# Patient Record
Sex: Male | Born: 1965 | Race: White | Hispanic: No | Marital: Single | Smoking: Never smoker
Health system: Southern US, Community
[De-identification: ages and names within clinical notes are randomized; demographics above are authoritative.]

## PROBLEM LIST (undated history)

## (undated) DIAGNOSIS — J45909 Unspecified asthma, uncomplicated: Secondary | ICD-10-CM

## (undated) HISTORY — PX: CHOLECYSTECTOMY: SHX55

## (undated) HISTORY — PX: BACK SURGERY: SHX140

---

## 2005-05-27 ENCOUNTER — Ambulatory Visit (HOSPITAL_COMMUNITY): Admission: RE | Admit: 2005-05-27 | Discharge: 2005-05-27 | Payer: Self-pay | Admitting: Family Medicine

## 2005-05-30 ENCOUNTER — Encounter (HOSPITAL_COMMUNITY): Admission: RE | Admit: 2005-05-30 | Discharge: 2005-06-29 | Payer: Self-pay | Admitting: Family Medicine

## 2005-06-05 ENCOUNTER — Ambulatory Visit: Payer: Self-pay | Admitting: Gastroenterology

## 2005-06-11 ENCOUNTER — Ambulatory Visit (HOSPITAL_COMMUNITY): Admission: RE | Admit: 2005-06-11 | Discharge: 2005-06-11 | Payer: Self-pay | Admitting: Internal Medicine

## 2005-06-11 ENCOUNTER — Ambulatory Visit: Payer: Self-pay | Admitting: Internal Medicine

## 2005-06-25 ENCOUNTER — Ambulatory Visit (HOSPITAL_COMMUNITY): Admission: RE | Admit: 2005-06-25 | Discharge: 2005-06-25 | Payer: Self-pay | Admitting: Internal Medicine

## 2005-07-14 ENCOUNTER — Observation Stay (HOSPITAL_COMMUNITY): Admission: RE | Admit: 2005-07-14 | Discharge: 2005-07-15 | Payer: Self-pay | Admitting: General Surgery

## 2005-07-14 ENCOUNTER — Encounter (INDEPENDENT_AMBULATORY_CARE_PROVIDER_SITE_OTHER): Payer: Self-pay | Admitting: Specialist

## 2005-07-31 ENCOUNTER — Ambulatory Visit (HOSPITAL_COMMUNITY): Admission: RE | Admit: 2005-07-31 | Discharge: 2005-07-31 | Payer: Self-pay | Admitting: General Surgery

## 2007-08-30 ENCOUNTER — Ambulatory Visit (HOSPITAL_COMMUNITY): Admission: RE | Admit: 2007-08-30 | Discharge: 2007-08-30 | Payer: Self-pay | Admitting: Family Medicine

## 2008-01-07 ENCOUNTER — Emergency Department (HOSPITAL_COMMUNITY): Admission: EM | Admit: 2008-01-07 | Discharge: 2008-01-07 | Payer: Self-pay | Admitting: Emergency Medicine

## 2008-02-29 ENCOUNTER — Ambulatory Visit (HOSPITAL_COMMUNITY): Admission: RE | Admit: 2008-02-29 | Discharge: 2008-02-29 | Payer: Self-pay | Admitting: Family Medicine

## 2008-03-02 ENCOUNTER — Encounter: Admission: RE | Admit: 2008-03-02 | Discharge: 2008-03-02 | Payer: Self-pay | Admitting: Family Medicine

## 2008-03-16 ENCOUNTER — Encounter: Admission: RE | Admit: 2008-03-16 | Discharge: 2008-03-16 | Payer: Self-pay | Admitting: Family Medicine

## 2008-04-10 ENCOUNTER — Ambulatory Visit (HOSPITAL_COMMUNITY): Admission: RE | Admit: 2008-04-10 | Discharge: 2008-04-11 | Payer: Self-pay | Admitting: Neurological Surgery

## 2009-04-20 ENCOUNTER — Ambulatory Visit (HOSPITAL_COMMUNITY): Admission: RE | Admit: 2009-04-20 | Discharge: 2009-04-20 | Payer: Self-pay | Admitting: Family Medicine

## 2010-03-31 ENCOUNTER — Encounter: Payer: Self-pay | Admitting: Family Medicine

## 2010-06-24 LAB — BASIC METABOLIC PANEL
GFR calc Af Amer: >60 mL/min (ref >60)
Glucose, Bld: 109 mg/dL — ABNORMAL HIGH (ref 70–99)
Potassium: 4.7 mEq/L (ref 3.5–5.1)
Sodium: 139 mEq/L (ref 135–145)

## 2010-06-24 LAB — CBC
HCT: 43.2 % (ref 39.0–52.0)
Hemoglobin: 14.4 g/dL (ref 13.0–17.0)
MCHC: 33.5 g/dL (ref 30.0–36.0)
MCV: 87.7 fL (ref 78.0–100.0)
RBC: 4.92 MIL/uL (ref 4.22–5.81)
RDW: 13.2 % (ref 11.5–15.5)
WBC: 7.2 10*3/uL (ref 4.0–10.5)

## 2010-07-23 NOTE — Op Note (Signed)
Miguel Wise, Miguel Wise                 ACCOUNT NO.:  [id number]   MEDICAL RECORD NO.:  [id number]          PATIENT TYPE:  OIB   LOCATION:  3535                         FACILITY:  [facility]   PHYSICIAN:  Stefani Dama, M.D.  DATE OF BIRTH:  03/09/1966   DATE OF PROCEDURE:  04/10/2008  DATE OF DISCHARGE:                               OPERATIVE REPORT   PREOPERATIVE DIAGNOSES:  Herniated nucleus pulposus, L2-L3 on the right  with the right lumbar radiculopathy L3 distribution.   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   PROCEDURE:  Laminotomy and microdiskectomy, L2-L3 right with operating  microscope microdissection technique.   SURGEON:  Stefani Dama, MD   FIRST ASSISTANT:  Danae Orleans. Venetia Maxon, MD   ANESTHESIA:  General endotracheal.   INDICATIONS:  Mr. Melman is a 45 year old individual who has had  significant neck, back, and right lower extremity pain and weakness.  He  has evidence of herniated nucleus pulposus at L2-L3 with a large  fragment that is resting behind the body blocking the L3 nerve root  exit.  He has been advised regarding surgical decompression via  laminotomy and microdiskectomy.   PROCEDURE:  The patient was brought to the operating room supine on the  stretcher.  After smooth induction of general endotracheal anesthesia,  he was turned prone.  The back was prepped with alcohol and DuraPrep,  and draped in sterile fashion.  Midline incision was created and carried  down to the lumbodorsal fascia after the L2-L3 space was identified.  The dye section was opened on the left side of the midline doing a  subperiosteal dissection at L2-L3.  Self-retaining Caspar retractor was  then placed.  A second localizing radiograph identified the L2-3 space  positively and then a laminotomy was created in this area, removing the  inferior margin lamina of L2 out to the medial wall of the facet  and  partial medial facetectomy was performed.  Superior margin lamina of L3  was also removed and the laminotomy was enlarged.  During the initial  opening, there was a small dural tear in the midline of the dura, which  was oversewn with two 6-0 Prolene sutures in interrupted fashion.  Then,  the dissection was carried out laterally.  The L3 nerve root was noted  be both dorsally and into the foramen and by dissecting underneath this,  a singular large fragment of disk was encountered and removed.  Removal  of the disk yielded significant hemorrhage, which required multiple  packings with Gelfoam to be able to control.  The hemostasis was  gradually obtained and further exploration with the use of the operating  microscope and microdissection technique yielded no other fragments.  Some posterior longitudinal ligament in this area was cauterized for  hemostasis and further exploration above and below the ligament, medial  and distal to it and also proximally to the level of the L2 nerve root  foramen yielded no other fragments.  The disk space itself was noted to  be intact.  There was a small bulge from little osteophytosis at this  level.  However, because of disk space itself appeared close and the  disk space was not explored.  With the decompression thus being  completed, hemostasis in the epidural space was achieved.  DuraSeal was  placed over the common dural tube and then the retractor was removed.  The lumbodorsal fascia was then  closed with #1 Vicryl in interrupted fashion, 2-0 Vicryl used in a  subcutaneous tissues, 3-0 Vicryl subcuticularly, and Dermabond was  placed on the skin.  The patient tolerated the procedure well and was  returned to recovery room in stable condition.      Stefani Dama, M.D.  Electronically Signed     HJE/MEDQ  D:  04/10/2008  T:  04/10/2008  Job:  [id number]

## 2010-07-26 NOTE — Op Note (Signed)
NAMEWOODARD, PERRELL                 ACCOUNT NO.:  [id number]   MEDICAL RECORD NO.:  [id number]          PATIENT TYPE:  AMB   LOCATION:  DAY                           FACILITY:  [facility]   PHYSICIAN:  Lionel December, M.D.    DATE OF BIRTH:  [birth year]   DATE OF PROCEDURE:  06/11/2005  DATE OF DISCHARGE:                                 OPERATIVE REPORT   PROCEDURE:  Esophagogastroduodenoscopy.   INDICATION:  Miguel Wise is a 45 year old Caucasian male with 10-week history of  postprandial nausea and vomiting occurring at least three to four times a  week.  He had negative upper abdominal ultrasound.  He had a hepatobiliary  scan with EF of 26.2% which is low, but he has had no pain whatsoever.  His  CBC, sed rate and comprehensive chemistry panel has been normal.  He has not  responded to PPI therapy.  He is undergoing diagnostic EGD.  Procedure risks  were reviewed the patient, informed consent was obtained.   MEDS FOR CONSCIOUS SEDATION:  Benzocaine spray for pharyngeal topical  anesthesia, Demerol 50 mg IV, Versed 6 mg IV.   FINDINGS:  Procedure performed in endoscopy suite.  The patient's vital  signs and O2 sat were monitored during procedure and remained stable.  The  patient was placed left lateral position and Olympus videoscope was via  oropharynx without any difficulty into esophagus.   Esophagus.  Mucosa of the esophagus was normal in the proximal and middle  segment.  Distal 4 to 5 cm have had 6 or 7 small erosions.  GE junction was  at 40 cm from the incisors and was normal.   Stomach.  It was empty and distended very well with insufflation.  Folds of  proximal stomach were normal.  Examination mucosa at body, antrum, pyloric  channel as well as angularis, fundus and cardia was normal.   Duodenum. Bulbar mucosa was normal.  Scope was passed second part of  duodenum where mucosa and folds were normal.  Endoscope was withdrawn.  The  patient tolerated the procedure well.   FINAL  DIAGNOSIS:  Erosive reflux esophagitis, otherwise normal EGD.   Etiology of his nausea, vomiting remains unclear.   RECOMMENDATIONS:  Will repeat his LFTs today and also check serum calcium  which has not been checked before as well as TSH.  For the time he will  continue Protonix 40 mg q.a.m. and I have asked him to keep written record  of these episodes.   If lab studies are completely normal, will proceed with abdominal pelvic CT  with oral and IV contrast and finally with gastric emptying study.  Abdominal pelvic CT and if this study is also normal, will do gastric  emptying study.      Lionel December, M.D.  Electronically Signed     NR/MEDQ  D:  06/11/2005  T:  06/12/2005  Job:  [id number]   cc:   Mila Homer. Sudie Bailey, M.D.  Fax: [phone]

## 2010-07-26 NOTE — Consult Note (Signed)
Miguel Wise, Miguel Wise                 ACCOUNT NO.:  [id number]   MEDICAL RECORD NO.:  [id number]          PATIENT TYPE:  AMB   LOCATION:  DAY                           FACILITY:  [facility]   PHYSICIAN:  Lionel December, M.D.    DATE OF BIRTH:  21-Aug-1965   DATE OF CONSULTATION:  06/05/2005  DATE OF DISCHARGE:                                   CONSULTATION   CHIEF COMPLAINT:  Postprandial nausea and vomiting.   REQUESTING PHYSICIAN:  Mila Homer. Sudie Bailey, M.D.   HISTORY OF PRESENT ILLNESS:  The patient is a 45 year old Caucasian male  with 8 to 10 week history of postprandial nausea, vomiting. Initially  occurred about every eight to nine days but is becoming more frequent. He is  now having vomiting a couple of times a week. There is no abdominal pain  whatsoever. Vomiting has occurred after various types of meals including  Cheerios, egg sandwich, pizza, etc. He is scared to eat and decreased his  oral intact. Really denies any significant weight loss. Bowel movements  generally have been regular up until recently, and he may go two days  without a BM now. Denies any melena, hematemesis, hematochezia. He had  subjective fever a couple of nights ago. This was isolated. He has gained 25  pounds in the last 6 months. He complains of intermittent heartburn which he  does not take anything for.   CURRENT MEDICATIONS:  1.  Allegra 180 mg p.r.n.  2.  Singulair daily.   ALLERGIES:  CODEINE.   PAST MEDICAL HISTORY:  1.  Seasonal allergies.  2.  Gastroesophageal reflux disease.   PAST SURGICAL HISTORY:  Right wrist surgery.   FAMILY HISTORY:  Father died of stroke at age 45. No family history of  colorectal cancer.   SOCIAL HISTORY:  He is single. He has one son. He is employed at [employer]. He has never been a smoker. Consumes one beer once or twice a day  when he plays golf.   REVIEW OF SYSTEMS:  See HPI for GI, constitutional. CARDIOPULMONARY:  Denies  chest pain or shortness  of breath.   PHYSICAL EXAMINATION:  VITAL SIGNS:  Weight 294, height 6 foot, temperature  98.3, blood pressure 128/86, pulse 92.  GENERAL:  Pleasant, morbidly obese, Caucasian male in no acute distress.  SKIN:  Warm and dry. No jaundice.  HEENT:  Conjunctivae are pink. Sclerae are nonicteric. Oropharyngeal moist  and pink. No lesions, erythema or exudate. No lymphadenopathy or  thyromegaly.  CHEST:  Lungs are clear to auscultation.  CARDIAC:  Reveals regular rate and rhythm. Normal S1 and S2. No murmurs,  rubs, or gallops.  ABDOMEN:  Positive bowel sounds. Obese but symmetrical, soft, nontender. No  organomegaly or masses. No rebound tenderness or guarding. No abdominal  bruits or hernias.  EXTREMITIES:  No edema.   IMPRESSION:  The patient is a 45 year old gentleman with 8 to 10 week  history of intermittent postprandial nausea and vomiting. Etiology is  unclear at this time. Symptoms were not typical of biliary dyskinesia. He  did have  a borderline low gallbladder ejection fraction of 26% on recent  HIDA scan but did not have reproduction of symptoms with fatty meal. He has  diffuse fatty infiltration of the liver and focal wall thickening of the  fundus at the gallbladder of unclear significance (on ultrasound). He does  have some typical heartburn symptoms. Prior to consideration of surgical  opinion for cholecystectomy, would highly recommend having his upper GI  tract evaluated via endoscopy. We will also give him a trial of PPI therapy  for GERD.   PLAN:  1.  EGD with Dr. Karilyn Cota in the near future.  2.  Protonix 40 mg daily, #20 samples provided.  3.  Will retrieve lab results from Dr. Michelle Nasuti office for review.      Tana Coast, P.A.      Lionel December, M.D.  Electronically Signed    LL/MEDQ  D:  06/05/2005  T:  06/07/2005  Job:  [id number]

## 2010-07-26 NOTE — Op Note (Signed)
NAMEENRICO, EADDY NO.:  [id number]   MEDICAL RECORD NO.:  [id number]          PATIENT TYPE:  INP   LOCATION:  A328                          FACILITY:  [facility]   PHYSICIAN:  Barbaraann Barthel, M.D. DATE OF BIRTH:  Sep 26, 1965   DATE OF PROCEDURE:  07/14/2005  DATE OF DISCHARGE:                                 OPERATIVE REPORT   SURGEON:  Dr. Barbaraann Barthel.   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   Note, this is a 45 year old white male who had recurrent episodes at least  10 weeks in duration of right upper quadrant discomfort with nausea and  vomiting. This pain was postprandial in nature and located in his right  upper quadrant radiated somewhat to his back.  The sonogram did not show any  evidence of stones; however, this showed a thickened gallbladder, CT scan  was negative, upper GI was negative. The hepatobiliary scan showed signs  suggestive of cholecystitis secondary to biliary dyskinesia with a  diminished ejection fraction of 26%.  He had no relief with restrictive diet  only minimal relief for this and no great improvement with Protonix.  He was  referred by the GI service for laparoscopic cholecystectomy.   We discussed the procedure in detail discussing complications not limited to  but including bleeding, infection, damage to bile ducts, perforation of  organs, transitory diarrhea and the possibility than an open procedure may  be required.  Informed consent was obtained.   GROSS OPERATIVE FINDINGS:  The patient had a gallbladder that had almost a  fatty envelope around it with fatty inflammation and we noted a small stone  that was sucked up through the suction device when we were removing the  gallbladder.  Other than that, no other abnormalities were encountered in  the right upper quadrant.   SPECIMEN:  Gallbladder.   TECHNIQUE:  The patient was placed in the supine position after the adequate  administration of general anesthesia via endotracheal intubation. His entire  abdomen was prepped with Betadine solution and draped in the usual manner.  Prior to this, a Foley catheter was aseptically inserted. With the patient  in Trendelenburg, an incision was carried out over the superior aspect of  the umbilicus. The subcutaneous tissue was dissected down to the fascia and  this was grasped with a sharp towel clip.  A Veress needle was inserted and  confirmed in position with a saline drop test.  We then placed an 11 mm  cannula in this site with a camera. We did this using the Visiport  technique.  We then under direct vision placed an 11 mm cannula in the  epigastrium and two 5 mm cannulas in the right upper quadrant laterally.  The gallbladder was grasped, its adhesions were taken down, the cystic duct  was clearly visualized.  At least four clips were placed on this distally  and the cystic artery was likewise doubly silver clipped and divided.  The  gallbladder was then  removed from the liver bed using the cautery device.  We then irrigated with normal saline solution.  I elected to place a Al Pimple drain in the liver bed and Surgicel.  After checking for hemostasis  and irrigating, we then desufflated the abdomen.  We closed the 11-mm  cannula sites in the epigastrium and the umbilicus with #0  Polysorb sutures  and then closed all incisions with the stapling device.  Prior to this, we  used 0.5% Sensorcaine at the port sites for postoperative comfort.  The  drain was sutured in place with 3-0 nylon.  Prior to closure, all sponge,  needle and instrument counts were found to be correct.  Estimated blood loss  was minimal.  The patient received 1500 mL of crystalloids intraoperatively.  There were no complications.      Barbaraann Barthel, M.D.  Electronically  Signed     WB/MEDQ  D:  07/14/2005  T:  07/15/2005  Job:  [id number]   cc:   Mila Homer. Sudie Bailey, M.D.  Fax: [phone]   Lionel December, M.D.  [address]  [place] [id number]

## 2010-12-09 LAB — URINALYSIS, ROUTINE W REFLEX MICROSCOPIC
Hgb urine dipstick: NEGATIVE
Nitrite: NEGATIVE
Protein, ur: NEGATIVE
Urobilinogen, UA: 0.2
pH: 5.5

## 2013-06-10 ENCOUNTER — Encounter (HOSPITAL_COMMUNITY): Payer: Self-pay | Admitting: Emergency Medicine

## 2013-06-10 ENCOUNTER — Emergency Department (HOSPITAL_COMMUNITY)
Admission: EM | Admit: 2013-06-10 | Discharge: 2013-06-10 | Disposition: A | Discharge status: Home / Self Care | Payer: BC Managed Care – PPO | Attending: Emergency Medicine | Admitting: Emergency Medicine

## 2013-06-10 DIAGNOSIS — J069 Acute upper respiratory infection, unspecified: Secondary | ICD-10-CM | POA: Insufficient documentation

## 2013-06-10 DIAGNOSIS — Z79899 Other long term (current) drug therapy: Secondary | ICD-10-CM | POA: Insufficient documentation

## 2013-06-10 DIAGNOSIS — J45909 Unspecified asthma, uncomplicated: Secondary | ICD-10-CM | POA: Insufficient documentation

## 2013-06-10 HISTORY — DX: Unspecified asthma, uncomplicated: J45.909

## 2013-06-10 MED ORDER — HYDROCOD POLST-CHLORPHEN POLST 10-8 MG/5ML PO LQCR: 5.0000 mL | Freq: Two times a day (BID) | ORAL | Status: DC | PRN

## 2013-06-10 MED ORDER — PREDNISONE 10 MG PO TABS: 20.0000 mg | ORAL_TABLET | Freq: Two times a day (BID) | ORAL | Status: DC

## 2013-06-10 NOTE — ED Notes (Signed)
Cough and congestion for a week, states his tongue feels funny past couple of days also, has a film on it and it feels like it is burning.  Pt has one dose of azithromycin left to take from his pmd

## 2013-06-10 NOTE — Discharge Instructions (Signed)
Continue Zithromax and Proair inhaler as before.  Start prednisone and Tussionex as prescribed.  Followup with your Dr. if not improving in the next several days, and return to the ER if you develop difficulty breathing, severe chest pain, or other new and concerning symptoms.   Upper Respiratory Infection, Adult An upper respiratory infection (URI) is also sometimes known as the common cold. The upper respiratory tract includes the nose, sinuses, throat, trachea, and bronchi. Bronchi are the airways leading to the lungs. Most people improve within 1 week, but symptoms can last up to 2 weeks. A residual cough may last even longer.  CAUSES Many different viruses can infect the tissues lining the upper respiratory tract. The tissues become irritated and inflamed and often become very moist. Mucus production is also common. A cold is contagious. You can easily spread the virus to others by oral contact. This includes kissing, sharing a glass, coughing, or sneezing. Touching your mouth or nose and then touching a surface, which is then touched by another person, can also spread the virus. SYMPTOMS  Symptoms typically develop 1 to 3 days after you come in contact with a cold virus. Symptoms vary from person to person. They may include:  Runny nose.  Sneezing.  Nasal congestion.  Sinus irritation.  Sore throat.  Loss of voice (laryngitis).  Cough.  Fatigue.  Muscle aches.  Loss of appetite.  Headache.  Low-grade fever. DIAGNOSIS  You might diagnose your own cold based on familiar symptoms, since most people get a cold 2 to 3 times a year. Your caregiver can confirm this based on your exam. Most importantly, your caregiver can check that your symptoms are not due to another disease such as strep throat, sinusitis, pneumonia, asthma, or epiglottitis. Blood tests, throat tests, and X-rays are not necessary to diagnose a common cold, but they may sometimes be helpful in excluding other  more serious diseases. Your caregiver will decide if any further tests are required. RISKS AND COMPLICATIONS  You may be at risk for a more severe case of the common cold if you smoke cigarettes, have chronic heart disease (such as heart failure) or lung disease (such as asthma), or if you have a weakened immune system. The very young and very old are also at risk for more serious infections. Bacterial sinusitis, middle ear infections, and bacterial pneumonia can complicate the common cold. The common cold can worsen asthma and chronic obstructive pulmonary disease (COPD). Sometimes, these complications can require emergency medical care and may be life-threatening. PREVENTION  The best way to protect against getting a cold is to practice good hygiene. Avoid oral or hand contact with people with cold symptoms. Wash your hands often if contact occurs. There is no clear evidence that vitamin C, vitamin E, echinacea, or exercise reduces the chance of developing a cold. However, it is always recommended to get plenty of rest and practice good nutrition. TREATMENT  Treatment is directed at relieving symptoms. There is no cure. Antibiotics are not effective, because the infection is caused by a virus, not by bacteria. Treatment may include:  Increased fluid intake. Sports drinks offer valuable electrolytes, sugars, and fluids.  Breathing heated mist or steam (vaporizer or shower).  Eating chicken soup or other clear broths, and maintaining good nutrition.  Getting plenty of rest.  Using gargles or lozenges for comfort.  Controlling fevers with ibuprofen or acetaminophen as directed by your caregiver.  Increasing usage of your inhaler if you have asthma. Zinc gel and  zinc lozenges, taken in the first 24 hours of the common cold, can shorten the duration and lessen the severity of symptoms. Pain medicines may help with fever, muscle aches, and throat pain. A variety of non-prescription medicines are  available to treat congestion and runny nose. Your caregiver can make recommendations and may suggest nasal or lung inhalers for other symptoms.  HOME CARE INSTRUCTIONS   Only take over-the-counter or prescription medicines for pain, discomfort, or fever as directed by your caregiver.  Use a warm mist humidifier or inhale steam from a shower to increase air moisture. This may keep secretions moist and make it easier to breathe.  Drink enough water and fluids to keep your urine clear or pale yellow.  Rest as needed.  Return to work when your temperature has returned to normal or as your caregiver advises. You may need to stay home longer to avoid infecting others. You can also use a face mask and careful hand washing to prevent spread of the virus. SEEK MEDICAL CARE IF:   After the first few days, you feel you are getting worse rather than better.  You need your caregiver's advice about medicines to control symptoms.  You develop chills, worsening shortness of breath, or brown or red sputum. These may be signs of pneumonia.  You develop yellow or brown nasal discharge or pain in the face, especially when you bend forward. These may be signs of sinusitis.  You develop a fever, swollen neck glands, pain with swallowing, or white areas in the back of your throat. These may be signs of strep throat. SEEK IMMEDIATE MEDICAL CARE IF:   You have a fever.  You develop severe or persistent headache, ear pain, sinus pain, or chest pain.  You develop wheezing, a prolonged cough, cough up blood, or have a change in your usual mucus (if you have chronic lung disease).  You develop sore muscles or a stiff neck. Document Released: 08/20/2000 Document Revised: 05/19/2011 Document Reviewed: 06/28/2010 [facility] Patient Information 2014 [employer], [place].

## 2013-06-10 NOTE — ED Provider Notes (Signed)
CSN: [id number]     Arrival date & time 06/10/13  0258 History   First MD Initiated Contact with Patient 06/10/13 0321     No chief complaint on file.    (Consider location/radiation/quality/duration/timing/severity/associated sxs/prior Treatment) HPI Comments: Patient is a 48 year old male with no significant past medical history. He presents today with complaints of cough and congestion for the past week. He was seen by his primary Dr. and started on a Z-Pak. He has had 4 doses of this however is not feeling much better. He continues to have cough and worsening breathing when he lies flat at night. He denies fevers or chills. He is a nonsmoker.  Patient is a 48 y.o. male presenting with URI. The history is provided by the patient.  URI Presenting symptoms: congestion and cough   Severity:  Moderate Onset quality:  Gradual Duration:  1 week Timing:  Constant Progression:  Worsening Chronicity:  New Relieved by:  Nothing Worsened by:  Nothing tried   Past Medical History  Diagnosis Date  . Asthma    History reviewed. No pertinent past surgical history. No family history on file. History  Substance Use Topics  . Smoking status: Never Smoker   . Smokeless tobacco: Not on file  . Alcohol Use: No    Review of Systems  HENT: Positive for congestion.   Respiratory: Positive for cough.   All other systems reviewed and are negative.      Allergies  Codeine  Home Medications   Current Outpatient Rx  Name  Route  Sig  Dispense  Refill  . albuterol (PROVENTIL HFA;VENTOLIN HFA) 108 (90 BASE) MCG/ACT inhaler   Inhalation   Inhale 1 puff into the lungs every 6 (six) hours as needed for wheezing or shortness of breath.          BP 124/68  Pulse 80  Temp(Src) 98.1 F (36.7 C) (Oral)  Resp 20  Ht 6' (1.829 m)  Wt 301 lb (136.533 kg)  BMI 40.81 kg/m2  SpO2 95% Physical Exam  Nursing note and vitals reviewed. Constitutional: He is oriented to person, place, and time.  He appears well-developed and well-nourished. No distress.  HENT:  Head: Normocephalic and atraumatic.  Mouth/Throat: Oropharynx is clear and moist.  Neck: Normal range of motion. Neck supple.  Cardiovascular: Normal rate, regular rhythm and normal heart sounds.   No murmur heard. Pulmonary/Chest: Effort normal and breath sounds normal. No respiratory distress. He has no wheezes. He has no rales. He exhibits no tenderness.  Abdominal: Soft. Bowel sounds are normal. He exhibits no distension. There is no tenderness.  Musculoskeletal: Normal range of motion. He exhibits no edema.  Lymphadenopathy:    He has no cervical adenopathy.  Neurological: He is alert and oriented to person, place, and time.  Skin: Skin is warm and dry. He is not diaphoretic.    ED Course  Procedures (including critical care time) Labs Review Labs Reviewed - No data to display Imaging Review No results found.   EKG Interpretation None      MDM   Final diagnoses:  None    Patient presents with a one-week history of chest congestion and cough. He is not improving with Zithromax. His lung exam is clear and oxygen saturations are 95%. I suspect a viral etiology which is why the antibiotic is not helping. I let prednisone and a cough medication into the mix and give this several more days. He is return if his symptoms worsen or change. I  do not feel as though a chest x-ray is indicated as this with not change my management should it show a pneumonia.    Geoffery Lyonsouglas Asiah Befort, MD 06/10/13 [phone]

## 2013-06-10 NOTE — ED Notes (Signed)
Patient was assessed by Dr. Judd Lienelo and discharged prior to RN assessment.  Discharge instructions and prescriptions given and reviewed with patient.  Patient verbalized understanding of sedating effects of Tussionex and to take Prednisone as directed.  Patient ambulatory; discharged home in good condition.

## 2014-06-06 ENCOUNTER — Ambulatory Visit (HOSPITAL_COMMUNITY)
Admission: RE | Admit: 2014-06-06 | Discharge: 2014-06-06 | Disposition: A | Discharge status: Home / Self Care | Payer: BLUE CROSS/BLUE SHIELD | Source: Ambulatory Visit | Attending: Family Medicine | Admitting: Family Medicine

## 2014-06-06 ENCOUNTER — Other Ambulatory Visit (HOSPITAL_COMMUNITY): Payer: Self-pay | Admitting: Family Medicine

## 2014-06-06 DIAGNOSIS — R05 Cough: Secondary | ICD-10-CM | POA: Insufficient documentation

## 2014-06-06 DIAGNOSIS — J45909 Unspecified asthma, uncomplicated: Secondary | ICD-10-CM | POA: Diagnosis not present

## 2014-06-06 DIAGNOSIS — M549 Dorsalgia, unspecified: Secondary | ICD-10-CM | POA: Diagnosis not present

## 2015-09-05 ENCOUNTER — Other Ambulatory Visit (HOSPITAL_COMMUNITY): Payer: Self-pay | Admitting: Family Medicine

## 2015-09-05 ENCOUNTER — Ambulatory Visit (HOSPITAL_COMMUNITY)
Admission: RE | Admit: 2015-09-05 | Discharge: 2015-09-05 | Disposition: A | Discharge status: Home / Self Care | Payer: BLUE CROSS/BLUE SHIELD | Source: Ambulatory Visit | Attending: Family Medicine | Admitting: Family Medicine

## 2015-09-05 DIAGNOSIS — M545 Low back pain: Secondary | ICD-10-CM

## 2015-09-05 DIAGNOSIS — M5136 Other intervertebral disc degeneration, lumbar region: Secondary | ICD-10-CM | POA: Diagnosis not present

## 2015-09-27 ENCOUNTER — Encounter (HOSPITAL_COMMUNITY): Payer: Self-pay | Admitting: Emergency Medicine

## 2015-09-27 ENCOUNTER — Emergency Department (HOSPITAL_COMMUNITY)
Admission: EM | Admit: 2015-09-27 | Discharge: 2015-09-27 | Disposition: A | Discharge status: Home / Self Care | Payer: BLUE CROSS/BLUE SHIELD | Attending: Emergency Medicine | Admitting: Emergency Medicine

## 2015-09-27 ENCOUNTER — Emergency Department (HOSPITAL_COMMUNITY): Payer: BLUE CROSS/BLUE SHIELD

## 2015-09-27 DIAGNOSIS — R3915 Urgency of urination: Secondary | ICD-10-CM | POA: Insufficient documentation

## 2015-09-27 DIAGNOSIS — Z791 Long term (current) use of non-steroidal anti-inflammatories (NSAID): Secondary | ICD-10-CM | POA: Insufficient documentation

## 2015-09-27 DIAGNOSIS — J45909 Unspecified asthma, uncomplicated: Secondary | ICD-10-CM | POA: Diagnosis not present

## 2015-09-27 DIAGNOSIS — R109 Unspecified abdominal pain: Secondary | ICD-10-CM | POA: Insufficient documentation

## 2015-09-27 DIAGNOSIS — R300 Dysuria: Secondary | ICD-10-CM | POA: Diagnosis not present

## 2015-09-27 DIAGNOSIS — Z79899 Other long term (current) drug therapy: Secondary | ICD-10-CM | POA: Diagnosis not present

## 2015-09-27 DIAGNOSIS — M549 Dorsalgia, unspecified: Secondary | ICD-10-CM | POA: Insufficient documentation

## 2015-09-27 LAB — URINALYSIS, ROUTINE W REFLEX MICROSCOPIC
BILIRUBIN URINE: NEGATIVE
Glucose, UA: NEGATIVE mg/dL
HGB URINE DIPSTICK: NEGATIVE
Ketones, ur: NEGATIVE mg/dL
Leukocytes, UA: NEGATIVE
Nitrite: NEGATIVE
PROTEIN: NEGATIVE mg/dL
pH: 6 (ref 5.0–8.0)

## 2015-09-27 MED ORDER — OXYCODONE-ACETAMINOPHEN 5-325 MG PO TABS: 1.0000 | ORAL_TABLET | ORAL | Status: DC | PRN

## 2015-09-27 MED ORDER — ONDANSETRON 8 MG PO TBDP
8.0000 mg | ORAL_TABLET | Freq: Once | ORAL | Status: AC
  Administered 2015-09-27: 8 mg via ORAL
  Filled 2015-09-27: qty 1

## 2015-09-27 MED ORDER — PREDNISONE 10 MG PO TABS: ORAL_TABLET | ORAL | Status: DC

## 2015-09-27 MED ORDER — HYDROMORPHONE HCL 1 MG/ML IJ SOLN
1.0000 mg | Freq: Once | INTRAMUSCULAR | Status: AC
Start: 2015-09-27 — End: 2015-09-27
  Administered 2015-09-27: 1 mg via INTRAMUSCULAR
  Filled 2015-09-27: qty 1

## 2015-09-27 MED ORDER — CYCLOBENZAPRINE HCL 10 MG PO TABS: 10.0000 mg | ORAL_TABLET | Freq: Three times a day (TID) | ORAL | Status: DC | PRN

## 2015-09-27 MED ORDER — HYDROMORPHONE HCL 1 MG/ML IJ SOLN
1.0000 mg | Freq: Once | INTRAMUSCULAR | Status: AC
  Administered 2015-09-27: 1 mg via INTRAMUSCULAR
  Filled 2015-09-27: qty 1

## 2015-09-27 NOTE — Discharge Instructions (Signed)
Flank Pain °Flank pain is pain in your side. The flank is the area of your side between your upper belly (abdomen) and your back. Pain in this area can be caused by many different things. °HOME CARE °Home care and treatment will depend on the cause of your pain. °· Rest as told by your doctor. °· Drink enough fluids to keep your pee (urine) clear or pale yellow.   °· Only take medicine as told by your doctor. °· Tell your doctor about any changes in your pain. °· Follow up with your doctor. °GET HELP RIGHT AWAY IF:  °· Your pain does not get better with medicine.   °· You have new symptoms or your symptoms get worse. °· Your pain gets worse.   °· You have belly (abdominal) pain.   °· You are short of breath.   °· You always feel sick to your stomach (nauseous).   °· You keep throwing up (vomiting).   °· You have puffiness (swelling) in your belly.   °· You feel light-headed or you pass out (faint).   °· You have blood in your pee. °· You have a fever or lasting symptoms for more than 2-3 days. °· You have a fever and your symptoms suddenly get worse. °MAKE SURE YOU:  °· Understand these instructions. °· Will watch your condition. °· Will get help right away if you are not doing well or get worse. °  °This information is not intended to replace advice given to you by your health care provider. Make sure you discuss any questions you have with your health care provider. °  °Document Released: 12/04/2007 Document Revised: 03/17/2014 Document Reviewed: 10/09/2011 °Elsevier Interactive Patient Education ©2016 [employer] ° °

## 2015-09-27 NOTE — ED Notes (Signed)
Pt reports L flank pain with radiation into his L groin. Pt reports L sided tenderness to palpation. Pt has taken pain medication without relief.

## 2015-09-27 NOTE — ED Notes (Signed)
Pt alert & oriented x4, stable gait. Patient given discharge instructions, paperwork & prescription(s). Patient informed not to drive, operate any equipment & handel any important documents 4 hours after taking pain medication. Patient  instructed to stop at the registration desk to finish any additional paperwork. Patient  verbalized understanding. Pt left department w/ no further questions. 

## 2015-09-29 NOTE — ED Provider Notes (Signed)
CSN: [id number]     Arrival date & time 09/27/15  1624 History   First MD Initiated Contact with Patient 09/27/15 1700     Chief Complaint  Patient presents with  . Flank Pain     (Consider location/radiation/quality/duration/timing/severity/associated sxs/prior Treatment) HPI  Miguel Wise is a 50 y.o. male who presents to the Emergency Department complaining of gradual onset of left flank pain for one week.  He states he saw his PMD and prescribed pain medication with some relief.  He states pain has been radiating to his left groin for one day.  He also reports urinary hesitancy.  He states the area is tender to touch and movement.  He also reports some nausea, but denies fever, vomiting, abd pain, penile discharge, testicular or scrotal pain or swelling.   Past Medical History  Diagnosis Date  . Asthma    Past Surgical History  Procedure Laterality Date  . Back surgery     Family History  Problem Relation Age of Onset  . Hypertension Mother    Social History  Substance Use Topics  . Smoking status: Never Smoker   . Smokeless tobacco: Never Used  . Alcohol Use: No    Review of Systems  Constitutional: Negative for fever, chills, activity change and appetite change.  Respiratory: Negative for chest tightness and shortness of breath.   Gastrointestinal: Negative for nausea, vomiting and abdominal pain.  Genitourinary: Positive for dysuria and urgency. Negative for frequency, hematuria, flank pain, decreased urine volume, penile swelling, scrotal swelling, difficulty urinating, penile pain and testicular pain.       Left groin pain  Musculoskeletal: Positive for back pain.  Skin: Negative for rash.  Neurological: Negative for dizziness, weakness and numbness.  Hematological: Negative for adenopathy.  Psychiatric/Behavioral: Negative for confusion.  All other systems reviewed and are negative.     Allergies  Codeine  Home Medications   Prior to Admission  medications   Medication Sig Start Date End Date Taking? Authorizing Provider  cefUROXime (CEFTIN) 500 MG tablet Take 500 mg by mouth 2 (two) times daily with a meal.   Yes Historical Provider, MD  ibuprofen (ADVIL,MOTRIN) 800 MG tablet Take 800 mg by mouth every 8 (eight) hours as needed for mild pain or moderate pain.   Yes Historical Provider, MD  cyclobenzaprine (FLEXERIL) 10 MG tablet Take 1 tablet (10 mg total) by mouth 3 (three) times daily as needed. 09/27/15   Bob Daversa, PA-C  oxyCODONE-acetaminophen (PERCOCET/ROXICET) 5-325 MG tablet Take 1 tablet by mouth every 4 (four) hours as needed. 09/27/15   Leota Maka, PA-C  predniSONE (DELTASONE) 10 MG tablet Take 6 tablets day one, 5 tablets day two, 4 tablets day three, 3 tablets day four, 2 tablets day five, then 1 tablet day six 09/27/15   Anacarolina Evelyn, PA-C   BP 134/78 mmHg  Pulse 68  Temp(Src) 97.8 F (36.6 C) (Oral)  Resp 20  Ht 6' (1.829 m)  Wt 140.161 kg  BMI 41.90 kg/m2  SpO2 96% Physical Exam  Constitutional: He is oriented to person, place, and time. He appears well-developed and well-nourished. No distress.  HENT:  Head: Normocephalic and atraumatic.  Neck: Normal range of motion.  Cardiovascular: Normal rate, regular rhythm and intact distal pulses.   No murmur heard. Pulmonary/Chest: Effort normal and breath sounds normal. No respiratory distress. He has no wheezes. He has no rales.  Abdominal: Soft. Normal appearance. He exhibits no distension and no mass. There is no hepatosplenomegaly. There  is no tenderness. There is no rigidity, no rebound, no guarding, no CVA tenderness and no tenderness at McBurney's point.  Musculoskeletal: Normal range of motion. He exhibits no edema.  ttp of the left lower lumbar paraspinal muscles and flank. Neg SLR bilaterally  Neurological: He is alert and oriented to person, place, and time. Coordination normal.  Skin: Skin is warm and dry. No rash noted.  Nursing note and vitals  reviewed.   ED Course  Procedures (including critical care time) Labs Review Labs Reviewed  URINALYSIS, ROUTINE W REFLEX MICROSCOPIC (NOT AT [facility]) - Abnormal; Notable for the following:    Specific Gravity, Urine >1.030 (*)    All other components within normal limits    Imaging Review  Ct Renal Stone Study  09/27/2015  CLINICAL DATA:  Left flank and groin pain.  No time course given. EXAM: CT ABDOMEN AND PELVIS WITHOUT CONTRAST TECHNIQUE: Multidetector CT imaging of the abdomen and pelvis was performed following the standard protocol without IV contrast. COMPARISON:  06/25/2005 FINDINGS: Lower chest: The lung bases are clear of acute process. No pleural effusion or pulmonary lesions. The heart is normal in size. No pericardial effusion. The distal esophagus and aorta are unremarkable. Hepatobiliary: No focal hepatic lesions or intrahepatic biliary dilatation. The gallbladder is surgically absent. No common bile duct dilatation. Pancreas: No mass, inflammation or ductal dilatation. Spleen: Normal size.  No focal lesions. Adrenals/Urinary Tract: The adrenal glands are normal. No renal, ureteral or bladder calculi.  No renal or bladder mass. Stomach/Bowel: The stomach, duodenum, small bowel and colon are grossly normal without oral contrast. No inflammatory changes, mass lesions or obstructive findings. The terminal ileum and appendix are normal. Vascular/Lymphatic: The aorta is normal in caliber. Scattered atherosclerotic calcifications. No mesenteric or retroperitoneal mass or adenopathy. Other: The prostate gland and seminal vesicles are unremarkable. No pelvic mass or adenopathy. No free pelvic fluid collections. No inguinal mass or adenopathy. No abdominal wall hernia or subcutaneous lesions. Musculoskeletal: No significant bony findings. 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 IMPRESSION: 1. No acute abdominal/pelvic findings, mass lesions or adenopathy. 2. No renal, ureteral or bladder calculi or  mass. 3. No inguinal mass or hernia. Electronically Signed   By: Rudie Meyer M.D.   On: 09/27/2015 18:45    I have personally reviewed and evaluated these images and lab results as part of my medical decision-making.   EKG Interpretation None      MDM   Final diagnoses:  Left flank pain    Pt with left flank pain for week with pain worsening on the day of arrival.  CT neg for kidney stone.  Pt is feeling better after pain medication.  Ambulates with a steady gait.  Appears stable for discharge.  Agrees to PMD f/u.      Pauline Aus, PA-C 09/29/15 2033  Samuel Jester, DO 09/29/15 2159

## 2017-12-07 IMAGING — DX DG LUMBAR SPINE COMPLETE 4+V
5 series · 5 of 5 positions shown · non-contrast
Comparison: None.

CLINICAL DATA: Mid to low back pain without known injury.

EXAM:
LUMBAR SPINE - COMPLETE 4+ VIEW

[l-spine ap]
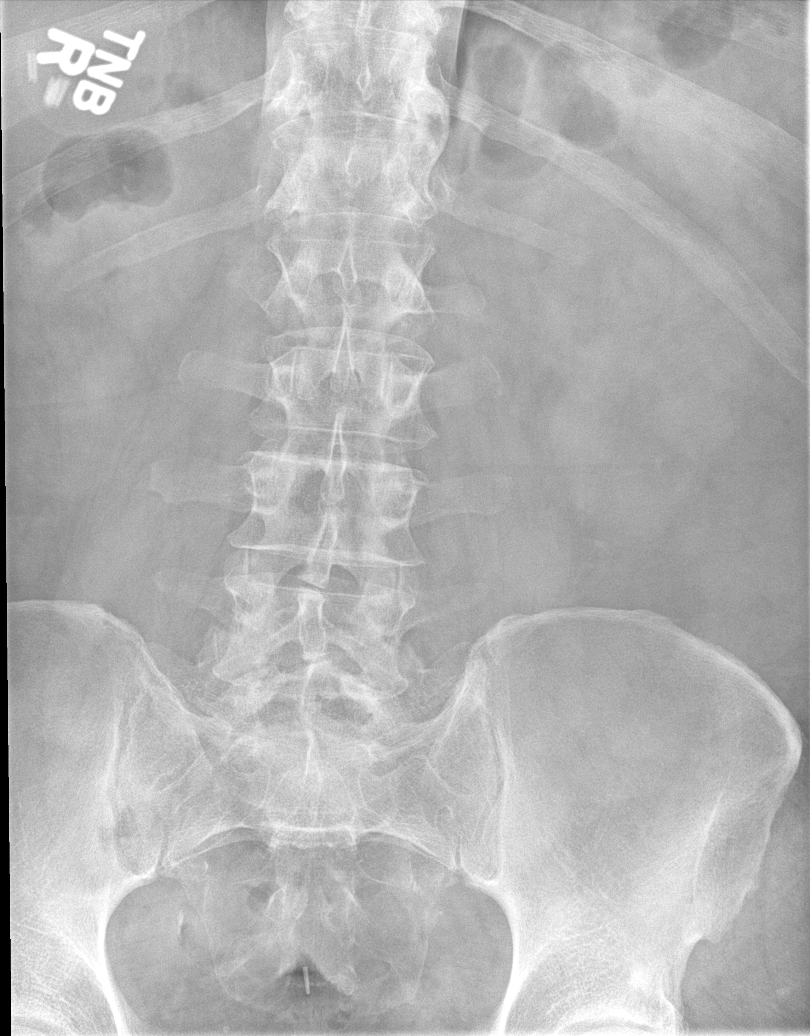

[l-spine obl (1 of 2)]
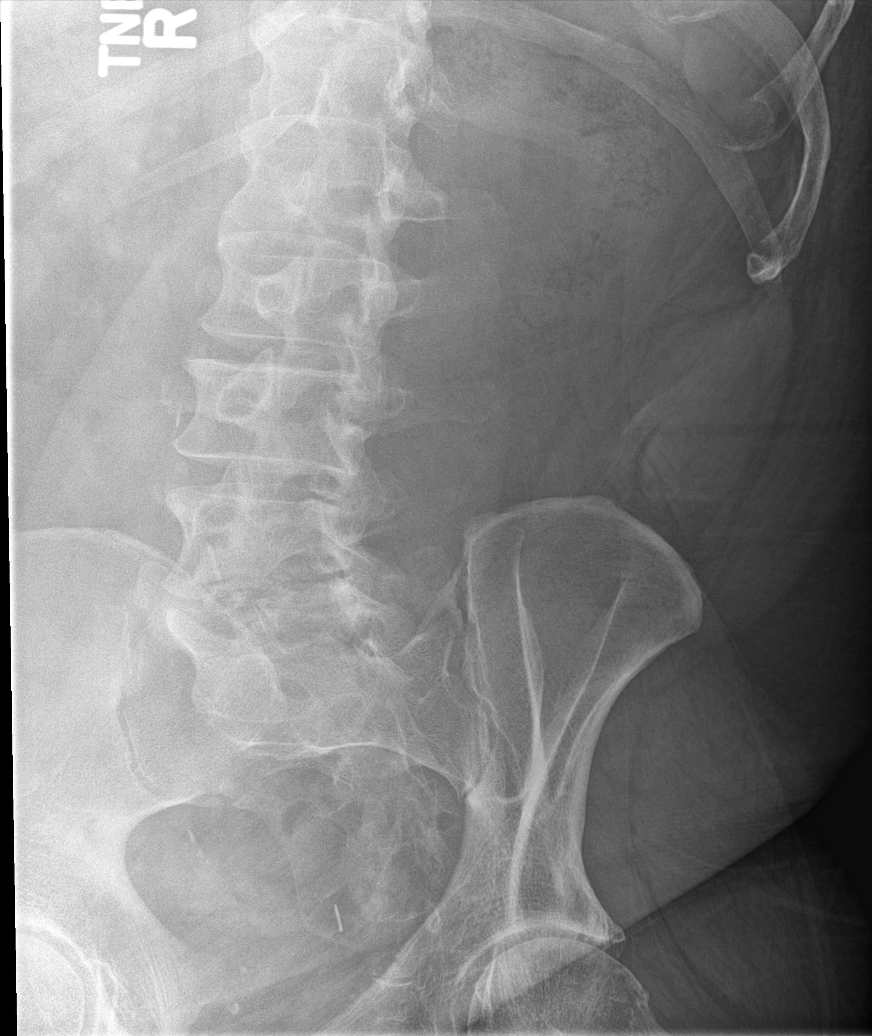

[l-spine obl (2 of 2)]
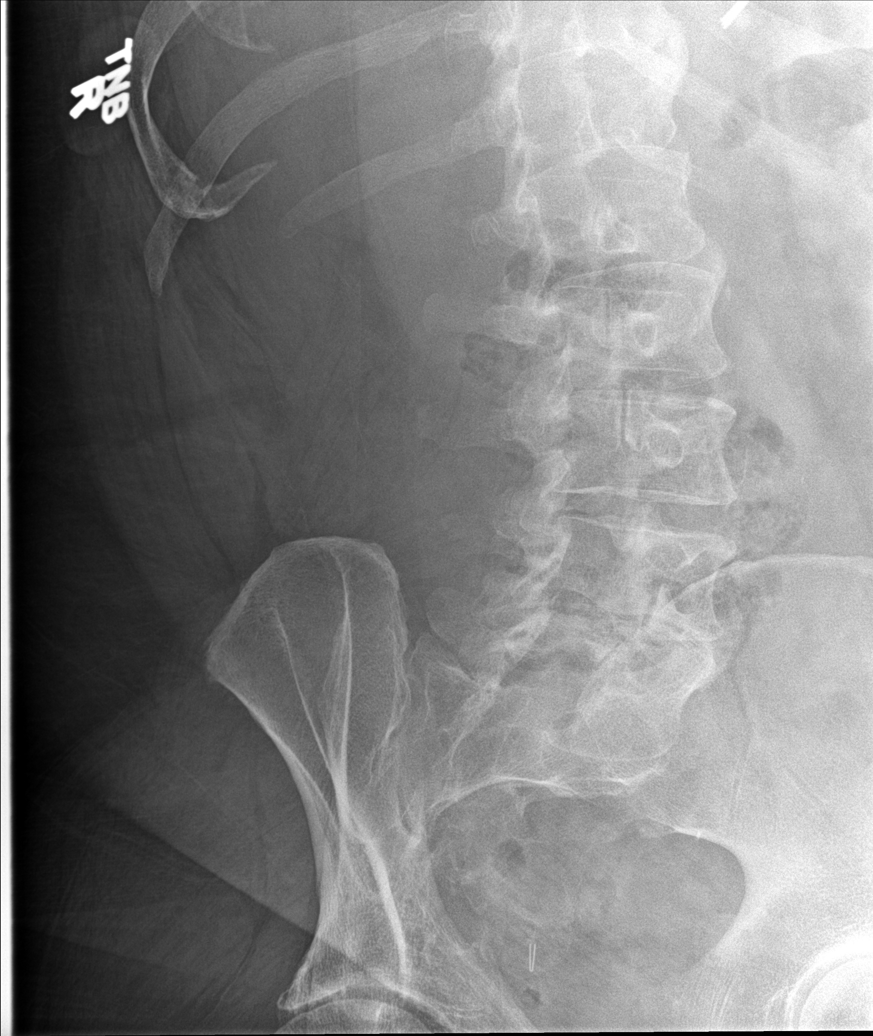

[l-spine lat]
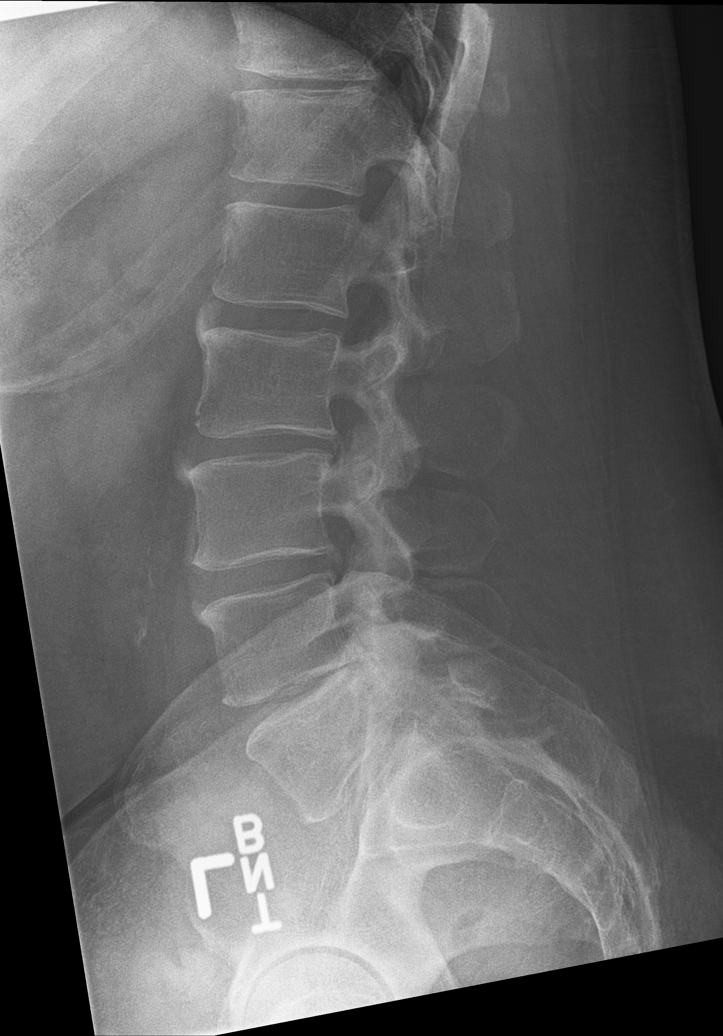

[l-spine spot]
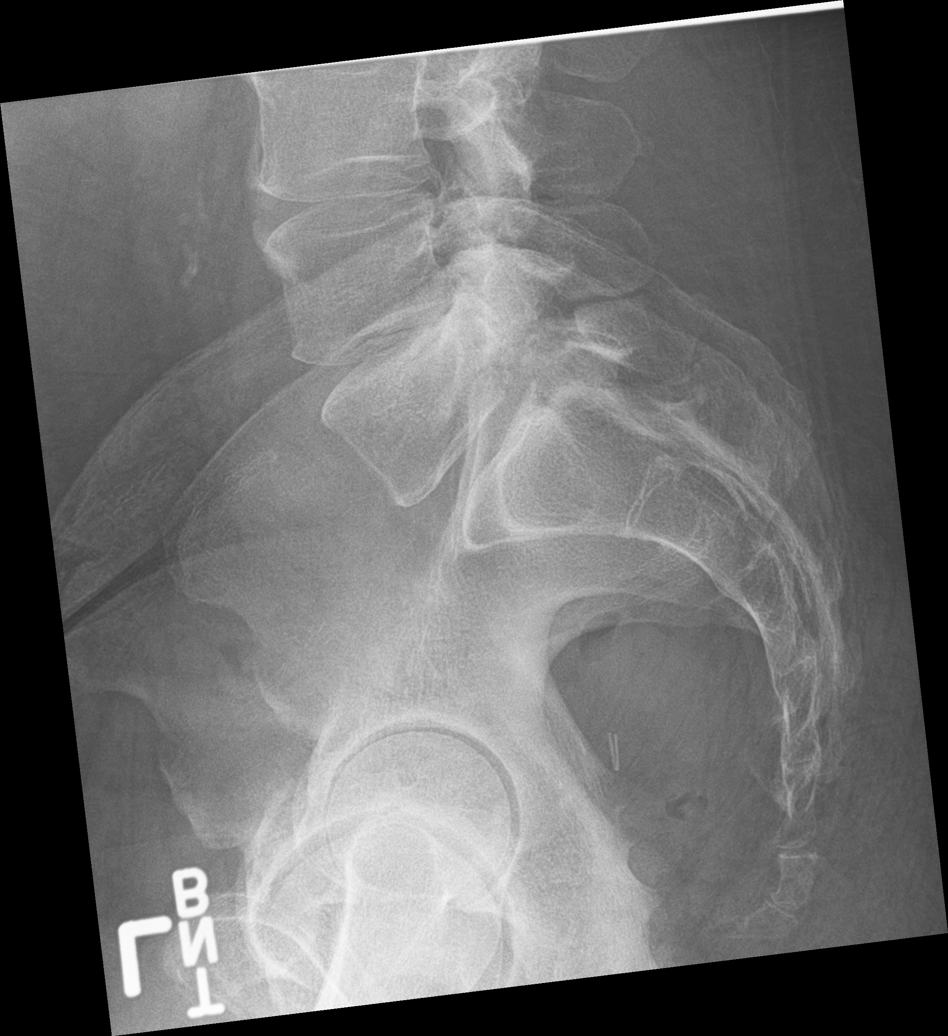

[5 of 5 positions shown; findings below may reference images not displayed]

FINDINGS: No fracture. No subluxation. Loss of intervertebral disc height is
seen at L4-5. No worrisome lytic or sclerotic osseous abnormality.
IMPRESSION: Degenerative changes at L4-5.

## 2019-12-26 ENCOUNTER — Emergency Department (HOSPITAL_COMMUNITY): Payer: Commercial Managed Care - PPO

## 2019-12-26 ENCOUNTER — Emergency Department (HOSPITAL_COMMUNITY)
Admission: EM | Admit: 2019-12-26 | Discharge: 2019-12-26 | Disposition: A | Discharge status: Home / Self Care | Payer: Commercial Managed Care - PPO | Attending: Emergency Medicine | Admitting: Emergency Medicine

## 2019-12-26 ENCOUNTER — Other Ambulatory Visit: Payer: Self-pay

## 2019-12-26 ENCOUNTER — Encounter (HOSPITAL_COMMUNITY): Payer: Self-pay

## 2019-12-26 DIAGNOSIS — J1282 Pneumonia due to coronavirus disease 2019: Secondary | ICD-10-CM | POA: Diagnosis not present

## 2019-12-26 DIAGNOSIS — R7989 Other specified abnormal findings of blood chemistry: Secondary | ICD-10-CM

## 2019-12-26 DIAGNOSIS — J4521 Mild intermittent asthma with (acute) exacerbation: Secondary | ICD-10-CM | POA: Insufficient documentation

## 2019-12-26 DIAGNOSIS — R509 Fever, unspecified: Secondary | ICD-10-CM

## 2019-12-26 DIAGNOSIS — R945 Abnormal results of liver function studies: Secondary | ICD-10-CM | POA: Insufficient documentation

## 2019-12-26 DIAGNOSIS — U071 COVID-19: Secondary | ICD-10-CM | POA: Insufficient documentation

## 2019-12-26 DIAGNOSIS — D696 Thrombocytopenia, unspecified: Secondary | ICD-10-CM

## 2019-12-26 LAB — RESPIRATORY PANEL BY RT PCR (FLU A&B, COVID)
Influenza A by PCR: NEGATIVE
Influenza B by PCR: NEGATIVE
SARS Coronavirus 2 by RT PCR: POSITIVE — AB

## 2019-12-26 LAB — CBC WITH DIFFERENTIAL/PLATELET
Abs Immature Granulocytes: 0.01 10*3/uL (ref 0.00–0.07)
Basophils Absolute: 0 10*3/uL (ref 0.0–0.1)
Basophils Relative: 0 %
Eosinophils Absolute: 0 10*3/uL (ref 0.0–0.5)
Eosinophils Relative: 0 %
HCT: 39.3 % (ref 39.0–52.0)
Hemoglobin: 13.5 g/dL (ref 13.0–17.0)
Immature Granulocytes: 0 %
Lymphocytes Relative: 12 %
Lymphs Abs: 0.4 10*3/uL — ABNORMAL LOW (ref 0.7–4.0)
MCH: 29.3 pg (ref 26.0–34.0)
MCHC: 34.4 g/dL (ref 30.0–36.0)
MCV: 85.2 fL (ref 80.0–100.0)
Monocytes Absolute: 0.2 10*3/uL (ref 0.1–1.0)
Monocytes Relative: 6 %
Neutro Abs: 3 10*3/uL (ref 1.7–7.7)
Neutrophils Relative %: 82 %
Platelets: 108 10*3/uL — ABNORMAL LOW (ref 150–400)
RBC: 4.61 MIL/uL (ref 4.22–5.81)
RDW: 13.7 % (ref 11.5–15.5)
WBC: 3.6 10*3/uL — ABNORMAL LOW (ref 4.0–10.5)
nRBC: 0 % (ref 0.0–0.2)

## 2019-12-26 LAB — COMPREHENSIVE METABOLIC PANEL
ALT: 45 U/L — ABNORMAL HIGH (ref 0–44)
AST: 75 U/L — ABNORMAL HIGH (ref 15–41)
Albumin: 3.8 g/dL (ref 3.5–5.0)
Alkaline Phosphatase: 49 U/L (ref 38–126)
Anion gap: 13 (ref 5–15)
BUN: 15 mg/dL (ref 6–20)
CO2: 21 mmol/L — ABNORMAL LOW (ref 22–32)
Calcium: 8.3 mg/dL — ABNORMAL LOW (ref 8.9–10.3)
Chloride: 102 mmol/L (ref 98–111)
Creatinine, Ser: 0.89 mg/dL (ref 0.61–1.24)
GFR, Estimated: >60 mL/min (ref >60)
Glucose, Bld: 125 mg/dL — ABNORMAL HIGH (ref 70–99)
Potassium: 3.5 mmol/L (ref 3.5–5.1)
Sodium: 136 mmol/L (ref 135–145)
Total Bilirubin: 1 mg/dL (ref 0.3–1.2)
Total Protein: 7 g/dL (ref 6.5–8.1)

## 2019-12-26 MED ORDER — DEXAMETHASONE SODIUM PHOSPHATE 10 MG/ML IJ SOLN: 10.0000 mg | Freq: Once | INTRAMUSCULAR | Status: DC

## 2019-12-26 MED ORDER — ALBUTEROL SULFATE HFA 108 (90 BASE) MCG/ACT IN AERS
4.0000 | INHALATION_SPRAY | Freq: Once | RESPIRATORY_TRACT | Status: AC
  Administered 2019-12-26: 4 via RESPIRATORY_TRACT
  Filled 2019-12-26: qty 6.7

## 2019-12-26 MED ORDER — DEXAMETHASONE SODIUM PHOSPHATE 10 MG/ML IJ SOLN
10.0000 mg | Freq: Once | INTRAMUSCULAR | Status: AC
  Administered 2019-12-26: 10 mg via INTRAMUSCULAR
  Filled 2019-12-26: qty 1

## 2019-12-26 NOTE — Discharge Instructions (Signed)
The infusion center will reach out to you to schedule covid antibodies if you are a candidate.  Return for worsening breathing difficulty or new concerns. Use albuterol as needed at home every 2-4 hours. Isolate for the next 8 days and notify people you have been around the past week.

## 2019-12-26 NOTE — ED Triage Notes (Signed)
Pt c/o fever, cough, and sob for past few days.

## 2019-12-26 NOTE — ED Notes (Signed)
Fluids offered. Pt tolerating well at this time.

## 2019-12-26 NOTE — ED Provider Notes (Signed)
[facility] EMERGENCY DEPARTMENT Provider Note   CSN: [id number] Arrival date & time: 12/26/19  6283     History Chief Complaint  Patient presents with  . Fever    Miguel Wise is a 54 y.o. male.  Patient with history of asthma, no home oxygen use presents with worsening fever, cough and mild shortness of breath since Saturday.  Patient was golfing prior to this and no known sick contacts or Covid contacts.  Patient denies recent surgeries, blood clot history, leg swelling or calf pain.  No cardiac history.  No chest pain.        Past Medical History:  Diagnosis Date  . Asthma     There are no problems to display for this patient.   Past Surgical History:  Procedure Laterality Date  . BACK SURGERY         Family History  Problem Relation Age of Onset  . Hypertension Mother     Social History   Tobacco Use  . Smoking status: Never Smoker  . Smokeless tobacco: Never Used  Substance Use Topics  . Alcohol use: No  . Drug use: No    Home Medications Prior to Admission medications   Medication Sig Start Date End Date Taking? Authorizing Provider  cefUROXime (CEFTIN) 500 MG tablet Take 500 mg by mouth 2 (two) times daily with a meal.    [provider]  cyclobenzaprine (FLEXERIL) 10 MG tablet Take 1 tablet (10 mg total) by mouth 3 (three) times daily as needed. 09/27/15   Triplett, Tammy, PA-C  ibuprofen (ADVIL,MOTRIN) 800 MG tablet Take 800 mg by mouth every 8 (eight) hours as needed for mild pain or moderate pain.    [provider]  oxyCODONE-acetaminophen (PERCOCET/ROXICET) 5-325 MG tablet Take 1 tablet by mouth every 4 (four) hours as needed. 09/27/15   Triplett, Tammy, PA-C  predniSONE (DELTASONE) 10 MG tablet Take 6 tablets day one, 5 tablets day two, 4 tablets day three, 3 tablets day four, 2 tablets day five, then 1 tablet day six 09/27/15   Triplett, Tammy, PA-C    Allergies    Codeine  Review of Systems   Review of Systems    Constitutional: Positive for fever. Negative for chills.  HENT: Positive for congestion.   Eyes: Negative for visual disturbance.  Respiratory: Positive for cough and shortness of breath.   Cardiovascular: Negative for chest pain.  Gastrointestinal: Negative for abdominal pain and vomiting.  Genitourinary: Negative for dysuria and flank pain.  Musculoskeletal: Negative for back pain, neck pain and neck stiffness.  Skin: Negative for rash.  Neurological: Negative for light-headedness and headaches.    Physical Exam Updated Vital Signs BP 126/73   Pulse 73   Temp 98.5 F (36.9 C) (Oral)   Resp 15   Ht 5\' 11"  (1.803 m)   Wt 135.2 kg   SpO2 98%   BMI 41.56 kg/m   Physical Exam Vitals and nursing note reviewed.  Constitutional:      Appearance: He is well-developed.  HENT:     Head: Normocephalic and atraumatic.  Eyes:     General:        Right eye: No discharge.        Left eye: No discharge.     Conjunctiva/sclera: Conjunctivae normal.  Neck:     Trachea: No tracheal deviation.  Cardiovascular:     Rate and Rhythm: Normal rate and regular rhythm.  Pulmonary:     Effort: Pulmonary effort is normal.  Breath sounds: Wheezing present.  Abdominal:     General: There is no distension.     Palpations: Abdomen is soft.     Tenderness: There is no abdominal tenderness. There is no guarding.  Musculoskeletal:     Cervical back: Normal range of motion and neck supple.  Skin:    General: Skin is warm.     Findings: No rash.  Neurological:     Mental Status: He is alert and oriented to person, place, and time.     ED Results / Procedures / Treatments   Labs (all labs ordered are listed, but only abnormal results are displayed) Labs Reviewed  RESPIRATORY PANEL BY RT PCR (FLU A&B, COVID) - Abnormal; Notable for the following components:      Result Value   SARS Coronavirus 2 by RT PCR POSITIVE (*)    All other components within normal limits  CBC WITH  DIFFERENTIAL/PLATELET - Abnormal; Notable for the following components:   WBC 3.6 (*)    Platelets 108 (*)    Lymphs Abs 0.4 (*)    All other components within normal limits  COMPREHENSIVE METABOLIC PANEL - Abnormal; Notable for the following components:   CO2 21 (*)    Glucose, Bld 125 (*)    Calcium 8.3 (*)    AST 75 (*)    ALT 45 (*)    All other components within normal limits    EKG EKG Interpretation  Date/Time:  Monday December 26 2019 07:45:23 EDT Ventricular Rate:  84 PR Interval:    QRS Duration: 84 QT Interval:  354 QTC Calculation: 419 R Axis:   68 Text Interpretation: Sinus rhythm Low voltage, precordial leads Confirmed by Blane Ohara [phone]) on 12/26/2019 9:18:13 AM   Radiology DG Chest Portable 1 View  Result Date: 12/26/2019 CLINICAL DATA:  Shortness of breath EXAM: PORTABLE CHEST 1 VIEW COMPARISON:  06/06/2014 FINDINGS: Mild infiltrate at the lung bases. Normal heart size and stable aortic contours. No effusion or pneumothorax IMPRESSION: Early pneumonia at the bases. Electronically Signed   By: Marnee Spring M.D.   On: 12/26/2019 08:00    Procedures Procedures (including critical care time)  Medications Ordered in ED Medications  albuterol (VENTOLIN HFA) 108 (90 Base) MCG/ACT inhaler 4 puff (4 puffs Inhalation Given 12/26/19 0833)  dexamethasone (DECADRON) injection 10 mg (10 mg Intramuscular Given 12/26/19 2536)    ED Course  I have reviewed the triage vital signs and the nursing notes.  Pertinent labs & imaging results that were available during my care of the patient were reviewed by me and considered in my medical decision making (see chart for details).    MDM Rules/Calculators/A&P                          Patient presents with clinical concern for respiratory infection as source of his fever and shortness of breath.  Patient does have mild wheezing on exam and asthma history so plan for albuterol inhaler, steroid Decadron IV.  Blood work  to check for significant leukocytosis, anemia that may explain shortness of breath and to check kidney function. Chest x-ray ordered and reviewed and infiltrate in the bases.  Covid test pending, if negative plan for antibiotics. EKG reviewed no acute abnormalities.  Blood work reviewed mild leukopenia 3.6. Chest x-ray ordered bilateral lower pneumonia.  Patient having normal work of breathing, normal oxygenation.  Steroids given due to possible asthma component.  Covid test returned positive.  Blood work reviewed delay due to difficulty with stick and LFTs 75/45 respectively, platelets 105 likely viral response. Message sent to antibody infusion clinic if patient is a candidate with his asthma history.  Miguel Wise was evaluated in Emergency Department on 12/26/2019 for the symptoms described in the history of present illness. He was evaluated in the context of the global COVID-19 pandemic, which necessitated consideration that the patient might be at risk for infection with the SARS-CoV-2 virus that causes COVID-19. Institutional protocols and algorithms that pertain to the evaluation of patients at risk for COVID-19 are in a state of rapid change based on information released by regulatory bodies including the CDC and federal and state organizations. These policies and algorithms were followed during the patient's care in the ED.   Final Clinical Impression(s) / ED Diagnoses Final diagnoses:  Fever in adult  Mild intermittent asthma with acute exacerbation  Pneumonia due to COVID-19 virus  LFT elevation  Thrombocytopenia [facility])    Rx / DC Orders ED Discharge Orders    None       Blane Ohara, MD 12/26/19 1205

## 2019-12-26 NOTE — ED Notes (Signed)
Date and time results received: 12/26/19 1033   Test: COVID Critical Value: Positive  Name of Provider Notified: Dr. Estell Harpin  Orders Received? Or Actions Taken?: No new orders given.

## 2019-12-27 ENCOUNTER — Ambulatory Visit (HOSPITAL_COMMUNITY)
Admission: RE | Admit: 2019-12-27 | Discharge: 2019-12-27 | Disposition: A | Discharge status: Home / Self Care | Payer: Commercial Managed Care - PPO | Source: Ambulatory Visit | Attending: Pulmonary Disease | Admitting: Pulmonary Disease

## 2019-12-27 ENCOUNTER — Other Ambulatory Visit: Payer: Self-pay | Admitting: Nurse Practitioner

## 2019-12-27 DIAGNOSIS — J45909 Unspecified asthma, uncomplicated: Secondary | ICD-10-CM | POA: Diagnosis present

## 2019-12-27 DIAGNOSIS — Z6841 Body Mass Index (BMI) 40.0 and over, adult: Secondary | ICD-10-CM | POA: Insufficient documentation

## 2019-12-27 DIAGNOSIS — I10 Essential (primary) hypertension: Secondary | ICD-10-CM

## 2019-12-27 DIAGNOSIS — U071 COVID-19: Secondary | ICD-10-CM | POA: Insufficient documentation

## 2019-12-27 DIAGNOSIS — J1282 Pneumonia due to coronavirus disease 2019: Secondary | ICD-10-CM | POA: Diagnosis present

## 2019-12-27 MED ORDER — FAMOTIDINE IN NACL 20-0.9 MG/50ML-% IV SOLN: 20.0000 mg | Freq: Once | INTRAVENOUS | Status: DC | PRN

## 2019-12-27 MED ORDER — DIPHENHYDRAMINE HCL 50 MG/ML IJ SOLN: 50.0000 mg | Freq: Once | INTRAMUSCULAR | Status: DC | PRN

## 2019-12-27 MED ORDER — METHYLPREDNISOLONE SODIUM SUCC 125 MG IJ SOLR: 125.0000 mg | Freq: Once | INTRAMUSCULAR | Status: DC | PRN

## 2019-12-27 MED ORDER — ALBUTEROL SULFATE HFA 108 (90 BASE) MCG/ACT IN AERS: 2.0000 | INHALATION_SPRAY | Freq: Once | RESPIRATORY_TRACT | Status: DC | PRN

## 2019-12-27 MED ORDER — SODIUM CHLORIDE 0.9 % IV SOLN: INTRAVENOUS | Status: DC | PRN

## 2019-12-27 MED ORDER — EPINEPHRINE 0.3 MG/0.3ML IJ SOAJ: 0.3000 mg | Freq: Once | INTRAMUSCULAR | Status: DC | PRN

## 2019-12-27 MED ORDER — SODIUM CHLORIDE 0.9 % IV SOLN: Freq: Once | INTRAVENOUS | Status: AC

## 2019-12-27 NOTE — Progress Notes (Signed)
  Diagnosis: COVID-19  Physician: dr Shan Levans  Procedure: Covid Infusion Clinic Med: bamlanivimab\etesevimab infusion - Provided patient with bamlanimivab\etesevimab fact sheet for patients, parents and caregivers prior to infusion.  Complications: No immediate complications noted.  Discharge: Discharged home   Shaune Spittle 12/27/2019

## 2019-12-27 NOTE — Discharge Instructions (Signed)

## 2019-12-27 NOTE — Progress Notes (Signed)
I connected by phone with Miguel Wise on 12/27/2019 at 11:04 AM to discuss the potential use of a new treatment for mild to moderate COVID-19 viral infection in non-hospitalized patients.  This patient is a 54 y.o. male that meets the FDA criteria for Emergency Use Authorization of COVID monoclonal antibody casirivimab/imdevimab or bamlanivimab/eteseviamb.  Has a (+) direct SARS-CoV-2 viral test result  Has mild or moderate COVID-19   Is NOT hospitalized due to COVID-19  Is within 10 days of symptom onset  Has at least one of the high risk factor(s) for progression to severe COVID-19 and/or hospitalization as defined in EUA.  Specific high risk criteria : BMI > 25, Cardiovascular disease or hypertension and Chronic Lung Disease   I have spoken and communicated the following to the patient or parent/caregiver regarding COVID monoclonal antibody treatment:  1. FDA has authorized the emergency use for the treatment of mild to moderate COVID-19 in adults and pediatric patients with positive results of direct SARS-CoV-2 viral testing who are 11 years of age and older weighing at least 40 kg, and who are at high risk for progressing to severe COVID-19 and/or hospitalization.  2. The significant known and potential risks and benefits of COVID monoclonal antibody, and the extent to which such potential risks and benefits are unknown.  3. Information on available alternative treatments and the risks and benefits of those alternatives, including clinical trials.  4. Patients treated with COVID monoclonal antibody should continue to self-isolate and use infection control measures (e.g., wear mask, isolate, social distance, avoid sharing personal items, clean and disinfect "high touch" surfaces, and frequent handwashing) according to CDC guidelines.   5. The patient or parent/caregiver has the option to accept or refuse COVID monoclonal antibody treatment.  After reviewing this information with  the patient, the patient has agreed to receive one of the available covid 19 monoclonal antibodies and will be provided an appropriate fact sheet prior to infusion. Tollie Eth, NP 12/27/2019 11:04 AM

## 2021-04-03 ENCOUNTER — Encounter: Payer: Self-pay | Admitting: Internal Medicine

## 2021-05-28 ENCOUNTER — Ambulatory Visit (INDEPENDENT_AMBULATORY_CARE_PROVIDER_SITE_OTHER): Payer: Self-pay | Admitting: *Deleted

## 2021-05-28 ENCOUNTER — Other Ambulatory Visit: Payer: Self-pay

## 2021-05-28 VITALS — Ht 72.0 in | Wt 308.0 lb

## 2021-05-28 DIAGNOSIS — Z1211 Encounter for screening for malignant neoplasm of colon: Secondary | ICD-10-CM

## 2021-05-28 NOTE — Progress Notes (Addendum)
Gastroenterology Pre-Procedure Review ? ?Request Date: 05/28/2021 ?Requesting Physician: Dr. Sudie Bailey, no previous TCS ? ?PATIENT REVIEW QUESTIONS: The patient responded to the following health history questions as indicated:   ? ?1. Diabetes Melitis: no ?2. Joint replacements in the past 12 months: no ?3. Major health problems in the past 3 months: no ?4. Has an artificial valve or MVP: no ?5. Has a defibrillator: no ?6. Has been advised in past to take antibiotics in advance of a procedure like teeth cleaning: no ?7. Family history of colon cancer: no  ?8. Alcohol Use: no ?9. Illicit drug Use: no ?10. History of sleep apnea: no  ?11. History of coronary artery or other vascular stents placed within the last 12 months: no ?12. History of any prior anesthesia complications: no ?13. Body mass index is 41.77 kg/m?. ?   ?MEDICATIONS & ALLERGIES:    ?Patient reports the following regarding taking any blood thinners:   ?Plavix? no ?Aspirin? no ?Coumadin? no ?Brilinta? no ?Xarelto? no ?Eliquis? no ?Pradaxa? no ?Savaysa? no ?Effient? no ? ?Patient confirms/reports the following medications:  ?Current Outpatient Medications  ?Medication Sig Dispense Refill  ? ibuprofen (ADVIL,MOTRIN) 800 MG tablet Take 800 mg by mouth as needed for mild pain or moderate pain.    ? losartan (COZAAR) 100 MG tablet Take 100 mg by mouth daily.    ? oxyCODONE-acetaminophen (PERCOCET/ROXICET) 5-325 MG tablet Take 1 tablet by mouth every 4 (four) hours as needed. (Patient taking differently: Take 1 tablet by mouth as needed.) 15 tablet 0  ? ?No current facility-administered medications for this visit.  ? ? ?Patient confirms/reports the following allergies:  ?Allergies  ?Allergen Reactions  ? Codeine   ? ? ?No orders of the defined types were placed in this encounter. ? ? ?AUTHORIZATION INFORMATION ?Primary Insurance: [place],  [place] #: [id number],  Group #: [id number] ?Pre-Cert / Berkley Harvey required: No, not required per Cassie ?Pre-Cert / Auth #: REF#:  [id number] ? ?SCHEDULE INFORMATION: ?Procedure has been scheduled as follows:  ?Date: 07/15/2021 , Time: 8:00 ?Location: [facility] with Dr. Marletta Lor ? ?This Gastroenterology Pre-Precedure Review Form is being routed to the following provider(s):  Lewie Loron, NP ?  ?

## 2021-06-06 NOTE — Progress Notes (Signed)
ASA 3. Appropriate.  °

## 2021-06-10 ENCOUNTER — Encounter: Payer: Self-pay | Admitting: *Deleted

## 2021-06-10 MED ORDER — PEG 3350-KCL-NA BICARB-NACL 420 G PO SOLR: 4000.0000 mL | Freq: Once | ORAL | 0 refills | Status: AC

## 2021-06-10 NOTE — Addendum Note (Signed)
Addended by: Noreene Larsson on: 06/10/2021 12:20 PM ? ? Modules accepted: Orders ? ?

## 2021-06-11 ENCOUNTER — Encounter: Payer: Self-pay | Admitting: *Deleted

## 2021-06-11 NOTE — Progress Notes (Signed)
Spoke to pt.  Scheduled procedure for 07/15/2021.  Pt made aware of Pre-op appointment on 07/11/2021 at 8:30 at [facility].  Pt informed that he will be given his procedure time at his Pre-op appointment.  Reviewed prep instructions with pt by phone.  Pt aware that I sent RX to pharmacy for his prep kit.  He was informed to pick up OTC required items.  Pt aware that I am mailing out instructions.  Confirmed mailing address. ?

## 2021-06-12 ENCOUNTER — Other Ambulatory Visit: Payer: Self-pay | Admitting: *Deleted

## 2021-07-10 NOTE — Patient Instructions (Signed)
? ? ? ? ? ? BENCE MI ? 07/10/2021  ?  ? @PREFPERIOPPHARMACY @ ? ? Your procedure is scheduled on  07/15/2021. ? ? Report to [facility] at  [phone]  A.M. ? ? Call this number if you have problems the morning of surgery: ? [phone] ? ? Remember: ? Follow the diet and prep instructions given to you by the office. ? ?  Use your inhaler before you come and bring your rescue inhaler with you. ?  ? ? Take these medicines the morning of surgery with A SIP OF WATER  ? ?                              hydrocodone(if needed) ?  ? ? Do not wear jewelry, make-up or nail polish. ? Do not wear lotions, powders, or perfumes, or deodorant. ? Do not shave 48 hours prior to surgery.  Men may shave face and neck. ? Do not bring valuables to the hospital. ? [facility] is not responsible for any belongings or valuables. ? ?Contacts, dentures or bridgework may not be worn into surgery.  Leave your suitcase in the car.  After surgery it may be brought to your room. ? ?For patients admitted to the hospital, discharge time will be determined by your treatment team. ? ?Patients discharged the day of surgery will not be allowed to drive home and must have someone with them for 24 hours.  ? ? ?Special instructions:   DO NOT smoke tobacco or vape for 24 hours before your procedure. ? ?Please read over the following fact sheets that you were given. ?Anesthesia Post-op Instructions and Care and Recovery After Surgery ?  ? ? ? Colonoscopy, Adult, Care After ?The following information offers guidance on how to care for yourself after your procedure. Your health care provider may also give you more specific instructions. If you have problems or questions, contact your health care provider. ?What can I expect after the procedure? ?After the procedure, it is common to have: ?A small amount of blood in your stool for 24 hours after the procedure. ?Some gas. ?Mild cramping or bloating of your abdomen. ?Follow these instructions at home: ?Eating and  drinking ? ?Drink enough fluid to keep your urine pale yellow. ?Follow instructions from your health care provider about eating or drinking restrictions. ?Resume your normal diet as told by your health care provider. Avoid heavy or fried foods that are hard to digest. ?Activity ?Rest as told by your health care provider. ?Avoid sitting for a long time without moving. Get up to take short walks every 1-2 hours. This is important to improve blood flow and breathing. Ask for help if you feel weak or unsteady. ?Return to your normal activities as told by your health care provider. Ask your health care provider what activities are safe for you. ?Managing cramping and bloating ? ?Try walking around when you have cramps or feel bloated. ?If directed, apply heat to your abdomen as told by your health care provider. Use the heat source that your health care provider recommends, such as a moist heat pack or a heating pad. ?Place a towel between your skin and the heat source. ?Leave the heat on for 20-30 minutes. ?Remove the heat if your skin turns bright red. This is especially important if you are unable to feel pain, heat, or cold. You have a greater risk of getting burned. ?General instructions ?If you  were given a sedative during the procedure, it can affect you for several hours. Do not drive or operate machinery until your health care provider says that it is safe. ?For the first 24 hours after the procedure: ?Do not sign important documents. ?Do not drink alcohol. ?Do your regular daily activities at a slower pace than normal. ?Eat soft foods that are easy to digest. ?Take over-the-counter and prescription medicines only as told by your health care provider. ?Keep all follow-up visits. This is important. ?Contact a health care provider if: ?You have blood in your stool 2-3 days after the procedure. ?Get help right away if: ?You have more than a small spotting of blood in your stool. ?You have large blood clots in your  stool. ?You have swelling of your abdomen. ?You have nausea or vomiting. ?You have a fever. ?You have increasing pain in your abdomen that is not relieved with medicine. ?These symptoms may be an emergency. Get help right away. Call 911. ?Do not wait to see if the symptoms will go away. ?Do not drive yourself to the hospital. ?Summary ?After the procedure, it is common to have a small amount of blood in your stool. You may also have mild cramping and bloating of your abdomen. ?If you were given a sedative during the procedure, it can affect you for several hours. Do not drive or operate machinery until your health care provider says that it is safe. ?Get help right away if you have a lot of blood in your stool, nausea or vomiting, a fever, or increased pain in your abdomen. ?This information is not intended to replace advice given to you by your health care provider. Make sure you discuss any questions you have with your health care provider. ?Document Revised: 10/17/2020 Document Reviewed: 10/17/2020 ?Elsevier Patient Education ? [place]. ?Monitored Anesthesia Care, Care After ?This sheet gives you information about how to care for yourself after your procedure. Your health care provider may also give you more specific instructions. If you have problems or questions, contact your health care provider. ?What can I expect after the procedure? ?After the procedure, it is common to have: ?Tiredness. ?Forgetfulness about what happened after the procedure. ?Impaired judgment for important decisions. ?Nausea or vomiting. ?Some difficulty with balance. ?Follow these instructions at home: ?For the time period you were told by your health care provider: ? ?  ? ?Rest as needed. ?Do not participate in activities where you could fall or become injured. ?Do not drive or use machinery. ?Do not drink alcohol. ?Do not take sleeping pills or medicines that cause drowsiness. ?Do not make important decisions or sign legal  documents. ?Do not take care of children on your own. ?Eating and drinking ?Follow the diet that is recommended by your health care provider. ?Drink enough fluid to keep your urine pale yellow. ?If you vomit: ?Drink water, juice, or soup when you can drink without vomiting. ?Make sure you have little or no nausea before eating solid foods. ?General instructions ?Have a responsible adult stay with you for the time you are told. It is important to have someone help care for you until you are awake and alert. ?Take over-the-counter and prescription medicines only as told by your health care provider. ?If you have sleep apnea, surgery and certain medicines can increase your risk for breathing problems. Follow instructions from your health care provider about wearing your sleep device: ?Anytime you are sleeping, including during daytime naps. ?While taking prescription pain medicines, sleeping  medicines, or medicines that make you drowsy. ?Avoid smoking. ?Keep all follow-up visits as told by your health care provider. This is important. ?Contact a health care provider if: ?You keep feeling nauseous or you keep vomiting. ?You feel light-headed. ?You are still sleepy or having trouble with balance after 24 hours. ?You develop a rash. ?You have a fever. ?You have redness or swelling around the IV site. ?Get help right away if: ?You have trouble breathing. ?You have new-onset confusion at home. ?Summary ?For several hours after your procedure, you may feel tired. You may also be forgetful and have poor judgment. ?Have a responsible adult stay with you for the time you are told. It is important to have someone help care for you until you are awake and alert. ?Rest as told. Do not drive or operate machinery. Do not drink alcohol or take sleeping pills. ?Get help right away if you have trouble breathing, or if you suddenly become confused. ?This information is not intended to replace advice given to you by your health care  provider. Make sure you discuss any questions you have with your health care provider. ?Document Revised: 01/29/2021 Document Reviewed: 01/27/2019 ?Elsevier Patient Education ? [place]. ? ?

## 2021-07-11 ENCOUNTER — Encounter (HOSPITAL_COMMUNITY): Payer: Self-pay

## 2021-07-11 ENCOUNTER — Encounter (HOSPITAL_COMMUNITY)
Admission: RE | Admit: 2021-07-11 | Discharge: 2021-07-11 | Disposition: A | Discharge status: Home / Self Care | Payer: Commercial Managed Care - PPO | Source: Ambulatory Visit | Attending: Internal Medicine | Admitting: Internal Medicine

## 2021-07-11 VITALS — BP 146/73 | HR 68 | Temp 97.8°F | Resp 18 | Ht 71.0 in | Wt 296.0 lb

## 2021-07-11 DIAGNOSIS — I10 Essential (primary) hypertension: Secondary | ICD-10-CM | POA: Insufficient documentation

## 2021-07-11 DIAGNOSIS — Z01810 Encounter for preprocedural cardiovascular examination: Secondary | ICD-10-CM | POA: Insufficient documentation

## 2021-07-15 ENCOUNTER — Ambulatory Visit (HOSPITAL_BASED_OUTPATIENT_CLINIC_OR_DEPARTMENT_OTHER): Payer: Commercial Managed Care - PPO | Admitting: Certified Registered Nurse Anesthetist

## 2021-07-15 ENCOUNTER — Ambulatory Visit (HOSPITAL_COMMUNITY)
Admission: RE | Admit: 2021-07-15 | Discharge: 2021-07-15 | Disposition: A | Discharge status: Home / Self Care | Payer: Commercial Managed Care - PPO | Attending: Internal Medicine | Admitting: Internal Medicine

## 2021-07-15 ENCOUNTER — Encounter (HOSPITAL_COMMUNITY): Payer: Self-pay

## 2021-07-15 ENCOUNTER — Encounter (HOSPITAL_COMMUNITY)
Admission: RE | Disposition: A | Discharge status: Home / Self Care | Payer: Self-pay | Source: Home / Self Care | Attending: Internal Medicine

## 2021-07-15 ENCOUNTER — Ambulatory Visit (HOSPITAL_COMMUNITY): Payer: Commercial Managed Care - PPO | Admitting: Certified Registered Nurse Anesthetist

## 2021-07-15 DIAGNOSIS — Z6841 Body Mass Index (BMI) 40.0 and over, adult: Secondary | ICD-10-CM | POA: Diagnosis not present

## 2021-07-15 DIAGNOSIS — J45909 Unspecified asthma, uncomplicated: Secondary | ICD-10-CM | POA: Insufficient documentation

## 2021-07-15 DIAGNOSIS — K5730 Diverticulosis of large intestine without perforation or abscess without bleeding: Secondary | ICD-10-CM | POA: Diagnosis not present

## 2021-07-15 DIAGNOSIS — Z1211 Encounter for screening for malignant neoplasm of colon: Secondary | ICD-10-CM | POA: Diagnosis present

## 2021-07-15 HISTORY — PX: COLONOSCOPY WITH PROPOFOL: SHX5780

## 2021-07-15 SURGERY — COLONOSCOPY WITH PROPOFOL
Anesthesia: General

## 2021-07-15 MED ORDER — LIDOCAINE HCL (CARDIAC) PF 100 MG/5ML IV SOSY
PREFILLED_SYRINGE | INTRAVENOUS | Status: DC | PRN
  Administered 2021-07-15: 50 mg via INTRAVENOUS

## 2021-07-15 MED ORDER — PROPOFOL 500 MG/50ML IV EMUL
INTRAVENOUS | Status: DC | PRN
  Administered 2021-07-15: 150 ug/kg/min via INTRAVENOUS

## 2021-07-15 MED ORDER — PROPOFOL 10 MG/ML IV BOLUS
INTRAVENOUS | Status: DC | PRN
  Administered 2021-07-15: 100 mg via INTRAVENOUS
  Administered 2021-07-15: 30 mg via INTRAVENOUS

## 2021-07-15 MED ORDER — LACTATED RINGERS IV SOLN
INTRAVENOUS | Status: DC
  Administered 2021-07-15: 1000 mL via INTRAVENOUS

## 2021-07-15 NOTE — Anesthesia Procedure Notes (Signed)
Date/Time: 07/15/2021 8:16 AM ?Performed by: Lorin Glass, CRNA ?Pre-anesthesia Checklist: Patient identified, Emergency Drugs available, Suction available and Patient being monitored ?Oxygen Delivery Method: Nasal cannula ? ? ? ? ?

## 2021-07-15 NOTE — H&P (Signed)
Primary Care Physician:  Lemmie Evens, MD ?Primary Gastroenterologist:  Dr. Abbey Chatters ? ?Pre-Procedure History & Physical: ?HPI:  Miguel Wise is a 56 y.o. male is here for first ever colonoscopy for colon cancer screening purposes.  Patient denies any family history of colorectal cancer.  No melena or hematochezia.  No abdominal pain or unintentional weight loss.  No change in bowel habits.  Overall feels well from a GI standpoint. ? ?Past Medical History:  ?Diagnosis Date  ? Asthma   ? ? ?Past Surgical History:  ?Procedure Laterality Date  ? BACK SURGERY    ? CHOLECYSTECTOMY    ? ? ?Prior to Admission medications   ?Medication Sig Start Date End Date Taking? Authorizing Provider  ?budesonide-formoterol (SYMBICORT) 160-4.5 MCG/ACT inhaler Inhale 1 puff into the lungs in the morning and at bedtime. 06/05/21  Yes [provider]  ?diclofenac (VOLTAREN) 75 MG EC tablet Take 75 mg by mouth 2 (two) times daily as needed for pain. 06/05/21  Yes [provider]  ?HYDROcodone-acetaminophen (NORCO) 10-325 MG tablet Take 1 tablet by mouth 3 (three) times daily as needed for severe pain. 03/20/21  Yes [provider]  ?ibuprofen (ADVIL,MOTRIN) 800 MG tablet Take 800 mg by mouth every 8 (eight) hours as needed for mild pain or moderate pain.   Yes [provider]  ?losartan (COZAAR) 100 MG tablet Take 100 mg by mouth daily.   Yes [provider]  ? ? ?Allergies as of 06/10/2021 - Review Complete 05/28/2021  ?Allergen Reaction Noted  ? Codeine  06/10/2013  ? ? ?Family History  ?Problem Relation Age of Onset  ? Hypertension Mother   ? ? ?Social History  ? ?Socioeconomic History  ? Marital status: Single  ?  Spouse name: Not on file  ? Number of children: Not on file  ? Years of education: Not on file  ? Highest education level: Not on file  ?Occupational History  ? Not on file  ?Tobacco Use  ? Smoking status: Never  ? Smokeless tobacco: Never  ?Substance and Sexual Activity  ? Alcohol  use: No  ? Drug use: No  ? Sexual activity: Not on file  ?Other Topics Concern  ? Not on file  ?Social History Narrative  ? Not on file  ? ?Social Determinants of Health  ? ?Financial Resource Strain: Not on file  ?Food Insecurity: Not on file  ?Transportation Needs: Not on file  ?Physical Activity: Not on file  ?Stress: Not on file  ?Social Connections: Not on file  ?Intimate Partner Violence: Not on file  ? ? ?Review of Systems: ?See HPI, otherwise negative ROS ? ?Physical Exam: ?Vital signs in last 24 hours: ?Temp:  [97.9 ?F (36.6 ?C)] 97.9 ?F (36.6 ?C) (05/08 0701) ?Resp:  [19] 19 (05/08 0701) ?BP: (147)/(79) 147/79 (05/08 0701) ?SpO2:  [97 %] 97 % (05/08 0701) ?  ?General:   Alert,  Well-developed, well-nourished, pleasant and cooperative in NAD ?Head:  Normocephalic and atraumatic. ?Eyes:  Sclera clear, no icterus.   Conjunctiva pink. ?Ears:  Normal auditory acuity. ?Nose:  No deformity, discharge,  or lesions. ?Mouth:  No deformity or lesions, dentition normal. ?Neck:  Supple; no masses or thyromegaly. ?Lungs:  Clear throughout to auscultation.   No wheezes, crackles, or rhonchi. No acute distress. ?Heart:  Regular rate and rhythm; no murmurs, clicks, rubs,  or gallops. ?Abdomen:  Soft, nontender and nondistended. No masses, hepatosplenomegaly or hernias noted. Normal bowel sounds, without guarding, and without rebound.   ?  Msk:  Symmetrical without gross deformities. Normal posture. ?Extremities:  Without clubbing or edema. ?Neurologic:  Alert and  oriented x4;  grossly normal neurologically. ?Skin:  Intact without significant lesions or rashes. ?Cervical Nodes:  No significant cervical adenopathy. ?Psych:  Alert and cooperative. Normal mood and affect. ? ?Impression/Plan: ?Miguel Wise is here for a colonoscopy to be performed for colon cancer screening purposes. ? ?The risks of the procedure including infection, bleed, or perforation as well as benefits, limitations, alternatives and imponderables have  been reviewed with the patient. Questions have been answered. All parties agreeable. ? ? ?

## 2021-07-15 NOTE — Anesthesia Preprocedure Evaluation (Signed)
Anesthesia Evaluation  ?Patient identified by MRN, date of birth, ID band ?Patient awake ? ? ? ?Reviewed: ?Allergy & Precautions, H&P , NPO status , Patient's Chart, lab work & pertinent test results, reviewed documented beta blocker date and time  ? ?Airway ?Mallampati: II ? ?TM Distance: >3 FB ?Neck ROM: full ? ? ? Dental ?no notable dental hx. ? ?  ?Pulmonary ?asthma ,  ?  ?Pulmonary exam normal ?breath sounds clear to auscultation ? ? ? ? ? ? Cardiovascular ?Exercise Tolerance: Good ?negative cardio ROS ? ? ?Rhythm:regular Rate:Normal ? ? ?  ?Neuro/Psych ?negative neurological ROS ? negative psych ROS  ? GI/Hepatic ?negative GI ROS, Neg liver ROS,   ?Endo/Other  ?Morbid obesity ? Renal/GU ?negative Renal ROS  ?negative genitourinary ?  ?Musculoskeletal ? ? Abdominal ?  ?Peds ? Hematology ?negative hematology ROS ?(+)   ?Anesthesia Other Findings ? ? Reproductive/Obstetrics ?negative OB ROS ? ?  ? ? ? ? ? ? ? ? ? ? ? ? ? ?  ?  ? ? ? ? ? ? ? ? ?Anesthesia Physical ?Anesthesia Plan ? ?ASA: 3 ? ?Anesthesia Plan: General  ? ?Post-op Pain Management:   ? ?Induction:  ? ?PONV Risk Score and Plan: Propofol infusion ? ?Airway Management Planned:  ? ?Additional Equipment:  ? ?Intra-op Plan:  ? ?Post-operative Plan:  ? ?Informed Consent: I have reviewed the patients History and Physical, chart, labs and discussed the procedure including the risks, benefits and alternatives for the proposed anesthesia with the patient or authorized representative who has indicated his/her understanding and acceptance.  ? ? ? ?Dental Advisory Given ? ?Plan Discussed with: CRNA ? ?Anesthesia Plan Comments:   ? ? ? ? ? ? ?Anesthesia Quick Evaluation ? ?

## 2021-07-15 NOTE — Discharge Instructions (Addendum)
  Colonoscopy Discharge Instructions  Read the instructions outlined below and refer to this sheet in the next few weeks. These discharge instructions provide you with general information on caring for yourself after you leave the hospital. Your doctor may also give you specific instructions. While your treatment has been planned according to the most current medical practices available, unavoidable complications occasionally occur.   ACTIVITY You may resume your regular activity, but move at a slower pace for the next 24 hours.  Take frequent rest periods for the next 24 hours.  Walking will help get rid of the air and reduce the bloated feeling in your belly (abdomen).  No driving for 24 hours (because of the medicine (anesthesia) used during the test).   Do not sign any important legal documents or operate any machinery for 24 hours (because of the anesthesia used during the test).  NUTRITION Drink plenty of fluids.  You may resume your normal diet as instructed by your doctor.  Begin with a light meal and progress to your normal diet. Heavy or fried foods are harder to digest and may make you feel sick to your stomach (nauseated).  Avoid alcoholic beverages for 24 hours or as instructed.  MEDICATIONS You may resume your normal medications unless your doctor tells you otherwise.  WHAT YOU CAN EXPECT TODAY Some feelings of bloating in the abdomen.  Passage of more gas than usual.  Spotting of blood in your stool or on the toilet paper.  IF YOU HAD POLYPS REMOVED DURING THE COLONOSCOPY: No aspirin products for 7 days or as instructed.  No alcohol for 7 days or as instructed.  Eat a soft diet for the next 24 hours.  FINDING OUT THE RESULTS OF YOUR TEST Not all test results are available during your visit. If your test results are not back during the visit, make an appointment with your caregiver to find out the results. Do not assume everything is normal if you have not heard from your  caregiver or the medical facility. It is important for you to follow up on all of your test results.  SEEK IMMEDIATE MEDICAL ATTENTION IF: You have more than a spotting of blood in your stool.  Your belly is swollen (abdominal distention).  You are nauseated or vomiting.  You have a temperature over 101.  You have abdominal pain or discomfort that is severe or gets worse throughout the day.   Unfortunately, your colon was not adequately prepped today for colonoscopy.  I did not see any evidence of colon cancer or large polyps, but certainly could have missed smaller polyps due to poor visualization.  Would recommend repeat colonoscopy in 3-6 months with a different colon prep.   I hope you have a great rest of your week!  Charles K. Carver, D.O. Gastroenterology and Hepatology [facility]  

## 2021-07-15 NOTE — Transfer of Care (Signed)
Immediate Anesthesia Transfer of Care Note ? ?Patient: Miguel Wise ? ?Procedure(s) Performed: COLONOSCOPY WITH PROPOFOL ? ?Patient Location: Short Stay ? ?Anesthesia Type:General ? ?Level of Consciousness: awake, alert  and oriented ? ?Airway & Oxygen Therapy: Patient Spontanous Breathing ? ?Post-op Assessment: Report given to RN and Post -op Vital signs reviewed and stable ? ?Post vital signs: Reviewed and stable ? ?Last Vitals:  ?Vitals Value Taken Time  ?BP 112/52 07/15/21 0838  ?Temp 36.7 ?C 07/15/21 0838  ?Pulse 78 07/15/21 0838  ?Resp 21 07/15/21 0838  ?SpO2 98 % 07/15/21 0838  ? ? ?Last Pain:  ?Vitals:  ? 07/15/21 0838  ?TempSrc: Oral  ?PainSc: 0-No pain  ?   ? ?Patients Stated Pain Goal: 7 (07/15/21 0701) ? ?Complications: No notable events documented. ?

## 2021-07-15 NOTE — Anesthesia Postprocedure Evaluation (Signed)
Anesthesia Post Note ? ?Patient: Miguel Wise ? ?Procedure(s) Performed: COLONOSCOPY WITH PROPOFOL ? ?Patient location during evaluation: Phase II ?Anesthesia Type: General ?Level of consciousness: awake ?Pain management: pain level controlled ?Vital Signs Assessment: post-procedure vital signs reviewed and stable ?Respiratory status: spontaneous breathing and respiratory function stable ?Cardiovascular status: blood pressure returned to baseline and stable ?Postop Assessment: no headache and no apparent nausea or vomiting ?Anesthetic complications: no ?Comments: Late entry ? ? ?No notable events documented. ? ? ?Last Vitals:  ?Vitals:  ? 07/15/21 0701 07/15/21 0838  ?BP: (!) 147/79 (!) 112/52  ?Pulse:  78  ?Resp: 19 (!) 21  ?Temp: 36.6 ?C 36.7 ?C  ?SpO2: 97% 98%  ?  ?Last Pain:  ?Vitals:  ? 07/15/21 0838  ?TempSrc: Oral  ?PainSc: 0-No pain  ? ? ?  ?  ?  ?  ?  ?  ? ?[place] ? ? ? ? ?

## 2021-07-15 NOTE — Op Note (Signed)
[facility] ?Patient Name: Miguel Wise ?Procedure Date: 07/15/2021 8:04 AM ?MRN: [id number] ?Date of Birth: 1966-03-06 ?Attending MD: Elon Alas. Abbey Chatters , DO ?CSN: [id number] ?Age: 56 ?Admit Type: Outpatient ?Procedure:                Colonoscopy ?Indications:              Screening for colorectal malignant neoplasm ?Providers:                Elon Alas. Abbey Chatters, DO, Caprice Kluver, Randa Spike,  ?                          Technician ?Referring MD:              ?Medicines:                See the Anesthesia note for documentation of the  ?                          administered medications ?Complications:            No immediate complications. ?Estimated Blood Loss:     Estimated blood loss: none. ?Procedure:                Pre-Anesthesia Assessment: ?                          - The anesthesia plan was to use monitored  ?                          anesthesia care (MAC). ?                          After obtaining informed consent, the colonoscope  ?                          was passed under direct vision. Throughout the  ?                          procedure, the patient's blood pressure, pulse, and  ?                          oxygen saturations were monitored continuously. The  ?                          [id number]-HQ190L ([id number]) scope was introduced through  ?                          the anus and advanced to the the cecum, identified  ?                          by appendiceal orifice and ileocecal valve. The  ?                          colonoscopy was performed without difficulty. The  ?                          patient tolerated the procedure well. The quality  ?  of the bowel preparation was evaluated using the  ?                          BBPS [facility] Bowel Preparation Scale) with scores  ?                          of: Right Colon = 1 (portion of mucosa seen, but  ?                          other areas not well seen due to staining, residual  ?                          stool and/or opaque liquid),  Transverse Colon = 2  ?                          (minor amount of residual staining, small fragments  ?                          of stool and/or opaque liquid, but mucosa seen  ?                          well) and Left Colon = 2 (minor amount of residual  ?                          staining, small fragments of stool and/or opaque  ?                          liquid, but mucosa seen well). The total BBPS score  ?                          equals 5. The quality of the bowel preparation was  ?                          inadequate. ?Scope In: 8:16:01 AM ?Scope Out: 8:33:58 AM ?Scope Withdrawal Time: 0 hours 14 minutes 42 seconds  ?Total Procedure Duration: 0 hours 17 minutes 57 seconds  ?Findings: ?     The perianal and digital rectal examinations were normal. ?     Many small and large-mouthed diverticula were found in the sigmoid colon  ?     and descending colon. ?     A large amount of stool was found in the ascending colon and in the  ?     cecum, precluding visualization. Lavage of the area was performed using  ?     copious amounts of sterile water, resulting in incomplete clearance with  ?     fair visualization. ?Impression:               - Preparation of the colon was inadequate. ?                          - Diverticulosis in the sigmoid colon and in the  ?  descending colon. ?                          - Stool in the ascending colon and in the cecum. ?                          - No specimens collected. ?Moderate Sedation: ?     Per Anesthesia Care ?Recommendation:           - Patient has a contact number available for  ?                          emergencies. The signs and symptoms of potential  ?                          delayed complications were discussed with the  ?                          patient. Return to normal activities tomorrow.  ?                          Written discharge instructions were provided to the  ?                          patient. ?                          - Resume  previous diet. ?                          - Continue present medications. ?                          - Repeat colonoscopy in 3-6 months because the  ?                          bowel preparation was suboptimal. ?                          - Return to GI clinic PRN. ?Procedure Code(s):        --- Professional --- ?                          J4970, Colorectal cancer screening; colonoscopy on  ?                          individual not meeting criteria for high risk ?Diagnosis Code(s):        --- Professional --- ?                          Z12.11, Encounter for screening for malignant  ?                          neoplasm of colon ?                          K57.30, Diverticulosis of large intestine  without  ?                          perforation or abscess without bleeding ?CPT copyright 2019 American Medical Association. All rights reserved. ?The codes documented in this report are preliminary and upon coder review may  ?be revised to meet current compliance requirements. ?Elon Alas. Abbey Chatters, DO ?Elon Alas. [place]au, DO ?07/15/2021 8:36:34 AM ?This report has been signed electronically. ?Number of Addenda: 0 ?

## 2021-07-22 ENCOUNTER — Encounter (HOSPITAL_COMMUNITY): Payer: Self-pay | Admitting: Internal Medicine

## 2021-09-17 ENCOUNTER — Encounter: Payer: Self-pay | Admitting: *Deleted
